# Patient Record
Sex: Male | Born: 1935
Health system: Southern US, Community
[De-identification: ages and names within clinical notes are randomized; demographics above are authoritative.]

## PROBLEM LIST (undated history)

## (undated) DIAGNOSIS — I10 Essential (primary) hypertension: Secondary | ICD-10-CM

## (undated) DIAGNOSIS — F0390 Unspecified dementia without behavioral disturbance: Secondary | ICD-10-CM

## (undated) DIAGNOSIS — E785 Hyperlipidemia, unspecified: Secondary | ICD-10-CM

## (undated) HISTORY — PX: BURR HOLE FOR SUBDURAL HEMATOMA: SHX1275

---

## 1997-10-06 ENCOUNTER — Ambulatory Visit (HOSPITAL_COMMUNITY): Admission: RE | Admit: 1997-10-06 | Discharge: 1997-10-06 | Payer: Self-pay | Admitting: Internal Medicine

## 1999-04-16 ENCOUNTER — Ambulatory Visit (HOSPITAL_COMMUNITY): Admission: RE | Admit: 1999-04-16 | Discharge: 1999-04-16 | Payer: Self-pay | Admitting: Cardiology

## 1999-04-23 ENCOUNTER — Ambulatory Visit (HOSPITAL_COMMUNITY): Admission: RE | Admit: 1999-04-23 | Discharge: 1999-04-23 | Payer: Self-pay | Admitting: Cardiology

## 1999-04-23 ENCOUNTER — Encounter: Payer: Self-pay | Admitting: Cardiology

## 2003-07-25 ENCOUNTER — Emergency Department (HOSPITAL_COMMUNITY): Admission: AD | Admit: 2003-07-25 | Discharge: 2003-07-25 | Payer: Self-pay | Admitting: Emergency Medicine

## 2003-07-28 ENCOUNTER — Emergency Department (HOSPITAL_COMMUNITY): Admission: EM | Admit: 2003-07-28 | Discharge: 2003-07-28 | Payer: Self-pay | Admitting: Emergency Medicine

## 2003-07-29 ENCOUNTER — Emergency Department (HOSPITAL_COMMUNITY): Admission: EM | Admit: 2003-07-29 | Discharge: 2003-07-29 | Payer: Self-pay | Admitting: Emergency Medicine

## 2003-07-30 ENCOUNTER — Inpatient Hospital Stay (HOSPITAL_COMMUNITY): Admission: AD | Admit: 2003-07-30 | Discharge: 2003-08-01 | Payer: Self-pay | Admitting: Emergency Medicine

## 2003-08-02 ENCOUNTER — Emergency Department (HOSPITAL_COMMUNITY): Admission: EM | Admit: 2003-08-02 | Discharge: 2003-08-02 | Payer: Self-pay | Admitting: Emergency Medicine

## 2006-06-10 ENCOUNTER — Encounter (HOSPITAL_COMMUNITY): Admission: RE | Admit: 2006-06-10 | Discharge: 2006-08-13 | Payer: Self-pay | Admitting: Cardiology

## 2007-07-24 ENCOUNTER — Emergency Department (HOSPITAL_COMMUNITY): Admission: EM | Admit: 2007-07-24 | Discharge: 2007-07-24 | Payer: Self-pay | Admitting: Emergency Medicine

## 2009-05-31 ENCOUNTER — Emergency Department (HOSPITAL_COMMUNITY): Admission: EM | Admit: 2009-05-31 | Discharge: 2009-05-31 | Payer: Self-pay | Admitting: Emergency Medicine

## 2010-05-06 ENCOUNTER — Emergency Department (HOSPITAL_COMMUNITY)
Admission: EM | Admit: 2010-05-06 | Discharge: 2010-05-06 | Payer: Self-pay | Source: Home / Self Care | Admitting: Emergency Medicine

## 2010-09-13 NOTE — Discharge Summary (Signed)
NAMEWALLIE, LAGRAND                            ACCOUNT NO.:  [id number]   MEDICAL RECORD NO.:  [id number]                   PATIENT TYPE:  INP   LOCATION:  2040                                 FACILITY:  [facility]   PHYSICIAN:  Mohan N. Sharyn Lull, M.D.              DATE OF BIRTH:  1926/10/04   DATE OF ADMISSION:  07/30/2003  DATE OF DISCHARGE:  08/01/2003                                 DISCHARGE SUMMARY   ADMISSION DIAGNOSES:  1. Recurrent epistaxis.  2. Anemia.  3. Hypercholesterolemia.  4. History of labile hypertension.   FINAL DIAGNOSES:  1. Status post recurrent epistaxis.  2. Anemia secondary to above.  3. History of labile blood pressure.  4. Hypercholesterolemia.  5. Elevated blood sugar, rule out diabetes mellitus.   DISCHARGE HOME MEDICATIONS:  Lipitor 10 mg one tablet daily.   ACTIVITY:  As tolerated.   DIET:  Low-salt, low-cholesterol, 1800 calorie ADA diet.  The patient has  been advised to monitor fasting blood sugar, fasting glucose, and hemoglobin  A1C in one week as an outpatient.   FOLLOWUP:  Follow up with me in one week.  Follow up with Dr. Pollyann Kennedy on  Thursday in two days.   CONDITION ON DISCHARGE:  Stable.   BRIEF HISTORY:  Mr. Brallier is a 77 year old black male with a past medical  history significant for labile blood pressure controlled with diet and  hypercholesterolemia.  He was admitted by Dr. Shana Chute on July 30, 2003,  because of recurrent nasal bleed for one week.  The patient was seen by ENT  and multiple times in the ER.  He had cauterization of the bleed on Friday  and subsequently again had bleeding on Saturday.  He then came to the ER and  was discharged.  He then came with profuse nasal bleeding and was noted to  be anemic, requiring blood transfusion.  The patient denies any chest pain,  palpitations, lightheadedness, or syncope.   MEDICATIONS:  At home, he was on Lipitor 10 mg p.o. daily.  Baby aspirin was  stopped last Monday.   FAMILY  HISTORY:  Noncontributory.   PHYSICAL EXAMINATION:  GENERAL APPEARANCE:  He was alert and awake.  VITAL SIGNS:  The blood pressure was 167/77 and the pulse was 129 with sinus  tachycardia on the monitor.  HEENT:  The left nostril was packed.  NECK:  No JVD.  LUNGS:  Clear to auscultation.  HEART:  S1 and S2 were normal.  There was no S3 gallop.  ABDOMEN:  Soft and nontender.  EXTREMITIES:  There was no cyanosis, clubbing, or edema.   LABORATORIES:  His sodium was 137, potassium 3.0, chloride 106, blood sugar  174, BUN 15, and creatinine 1.0.  The hemoglobin was 9.9 and hematocrit  29.0.  The repeat hemoglobin was 8.6 and hematocrit 25.2.  Post transfusion  of one unit, the hemoglobin was 9.4, hematocrit 27.0, and  white count 7.3.  His PT was 13.9, INR 1.1, and PTT 39.  His hemoglobin today is 9.8 and  hematocrit 29.3.  The potassium is 3.9, glucose 148, BUN 7, and creatinine  1.0.   BRIEF HOSPITAL COURSE:  The patient was admitted to the telemetry unit.  The  patient received one unit of packed red blood cells.  The patient did not  have any further episodes of nasal bleed.  ENT consultation was called with  Dr. Pollyann Kennedy.  The patient's hemoglobin has been stable for the last 48 hours.  The patient still has nasal packing.  The plan is to keep the nasal packing  for a total of four days.  The patient will be seeing Dr. Pollyann Kennedy on Thursday  for removal of the packing and possible cauterization.  The patient's blood  sugar is running in the range of 130-160.  In the past, the patient has had  normal blood sugar.  We will check fasting blood sugar in one week and also  hemoglobin A1C as an outpatient.  The patient will be discharged home on the  above medications and will be followed up in my office in one week.                                                Eduardo Osier. Sharyn Lull, M.D.    [facility]  D:  08/01/2003  T:  08/03/2003  Job:  [id number]

## 2011-04-08 ENCOUNTER — Emergency Department (HOSPITAL_COMMUNITY): Payer: Medicare Other

## 2011-04-08 ENCOUNTER — Encounter: Payer: Self-pay | Admitting: Emergency Medicine

## 2011-04-08 ENCOUNTER — Emergency Department (HOSPITAL_COMMUNITY)
Admission: EM | Admit: 2011-04-08 | Discharge: 2011-04-08 | Disposition: A | Discharge status: Home / Self Care | Payer: Medicare Other | Attending: Emergency Medicine | Admitting: Emergency Medicine

## 2011-04-08 DIAGNOSIS — R070 Pain in throat: Secondary | ICD-10-CM | POA: Insufficient documentation

## 2011-04-08 DIAGNOSIS — Z79899 Other long term (current) drug therapy: Secondary | ICD-10-CM | POA: Insufficient documentation

## 2011-04-08 DIAGNOSIS — J40 Bronchitis, not specified as acute or chronic: Secondary | ICD-10-CM | POA: Insufficient documentation

## 2011-04-08 DIAGNOSIS — R059 Cough, unspecified: Secondary | ICD-10-CM | POA: Insufficient documentation

## 2011-04-08 DIAGNOSIS — R05 Cough: Secondary | ICD-10-CM | POA: Insufficient documentation

## 2011-04-08 MED ORDER — ALBUTEROL SULFATE HFA 108 (90 BASE) MCG/ACT IN AERS: 2.0000 | INHALATION_SPRAY | RESPIRATORY_TRACT | Status: DC | PRN

## 2011-04-08 MED ORDER — BENZONATATE 200 MG PO CAPS: 200.0000 mg | ORAL_CAPSULE | Freq: Three times a day (TID) | ORAL | Status: AC | PRN

## 2011-04-08 MED ORDER — AZITHROMYCIN 250 MG PO TABS: 250.0000 mg | ORAL_TABLET | Freq: Every day | ORAL | Status: AC

## 2011-04-08 MED ORDER — AZITHROMYCIN 250 MG PO TABS
500.0000 mg | ORAL_TABLET | Freq: Once | ORAL | Status: AC
  Administered 2011-04-08: 500 mg via ORAL
  Filled 2011-04-08: qty 2

## 2011-04-08 NOTE — ED Notes (Signed)
PT. REPORTS PERSISTENT DRY COUGH WITH SLIGHT SOB X2 DAYS , HEADACHE , DENIES FEVER OR CHILLS.

## 2011-04-08 NOTE — ED Provider Notes (Signed)
History     CSN: [id number] Arrival date & time: 04/08/2011  1:49 AM   First MD Initiated Contact with Patient 04/08/11 0405      Chief Complaint  Patient presents with  . Cough    (Consider location/radiation/quality/duration/timing/severity/associated sxs/prior treatment) HPI Comments: Healthy 77 year old male who presents with a cough for the last 2 days. Symptoms are intermittent, dry but feels like he has mucus rattling around in his chest. He does have an associated sore throat but no fevers chills nausea or vomiting. Symptoms are persistent, nothing makes better or worse, symptoms are mild to moderate at this time  Patient is a 77 y.o. male presenting with cough. The history is provided by the patient.  Cough    History reviewed. No pertinent past medical history.  History reviewed. No pertinent past surgical history.  No family history on file.  History  Substance Use Topics  . Smoking status: Never Smoker   . Smokeless tobacco: Not on file  . Alcohol Use: No      Review of Systems  Respiratory: Positive for cough.   All other systems reviewed and are negative.    Allergies  Penicillins  Home Medications   Current Outpatient Rx  Name Route Sig Dispense Refill  . ATORVASTATIN CALCIUM 10 MG PO TABS Oral Take 10 mg by mouth daily.      . ALBUTEROL SULFATE HFA 108 (90 BASE) MCG/ACT IN AERS Inhalation Inhale 2 puffs into the lungs every 4 (four) hours as needed for wheezing or shortness of breath. 1 Inhaler 3  . AZITHROMYCIN 250 MG PO TABS Oral Take 1 tablet (250 mg total) by mouth daily. 500mg  PO day 1, then 250mg  PO days 205 6 tablet 0  . BENZONATATE 200 MG PO CAPS Oral Take 1 capsule (200 mg total) by mouth 3 (three) times daily as needed for cough. 20 capsule 0    BP 159/81  Pulse 101  Temp(Src) 98.1 F (36.7 C) (Oral)  Resp 19  SpO2 95%  Physical Exam  Nursing note and vitals reviewed. Constitutional: He appears well-developed and  well-nourished. No distress.  HENT:  Head: Normocephalic and atraumatic.  Mouth/Throat: Oropharynx is clear and moist. No oropharyngeal exudate.  Eyes: Conjunctivae and EOM are normal. Pupils are equal, round, and reactive to light. Right eye exhibits no discharge. Left eye exhibits no discharge. No scleral icterus.  Neck: Normal range of motion. Neck supple. No JVD present. No thyromegaly present.  Cardiovascular: Normal rate, regular rhythm, normal heart sounds and intact distal pulses.  Exam reveals no gallop and no friction rub.   No murmur heard. Pulmonary/Chest: Effort normal and breath sounds normal. No respiratory distress. He has no wheezes. He has no rales.  Abdominal: Soft. Bowel sounds are normal. He exhibits no distension and no mass. There is no tenderness.  Musculoskeletal: Normal range of motion. He exhibits no edema and no tenderness.  Lymphadenopathy:    He has no cervical adenopathy.  Neurological: He is alert. Coordination normal.  Skin: Skin is warm and dry. No rash noted. No erythema.  Psychiatric: He has a normal mood and affect. His behavior is normal.    ED Course  Procedures (including critical care time)  Labs Reviewed - No data to display Dg Chest 2 View  04/08/2011  *RADIOLOGY REPORT*  Clinical Data: Cough and shortness of breath for 2 days.  Headache.  CHEST - 2 VIEW  Comparison: None.  Findings: Emphysematous changes in the lungs. The heart size and  pulmonary vascularity are normal. The lungs appear clear and expanded without focal air space disease or consolidation. No blunting of the costophrenic angles. Mild thoracic scoliosis convex towards the right.  Degenerative changes in the thoracic spine. Suggestion of coarsened trabecula in the right proximal humerus which might represent old fracture deformity or possibly changes due to Paget's disease.  IMPRESSION: Emphysematous changes in the chest.  No evidence of active pulmonary disease.  Original Report  Authenticated By: Marlon Pel, M.D.     1. Bronchitis       MDM  Lungs are clear, chest x-ray reveals no signs of infiltrates. The patient is not currently a smoker and has not been for 30 years, has vital signs which are reassuring and his in no respiratory distress. He has no increased work of breathing, fevers, vomiting and is able to take medicines by mouth. He agrees to followup with his family Dr. within 2-3 days if not improved        Vida Roller, MD 04/08/11 [phone]

## 2015-11-05 ENCOUNTER — Other Ambulatory Visit: Payer: Self-pay | Admitting: Cardiology

## 2015-11-05 DIAGNOSIS — R079 Chest pain, unspecified: Secondary | ICD-10-CM

## 2015-11-09 ENCOUNTER — Encounter (HOSPITAL_COMMUNITY)
Admission: RE | Admit: 2015-11-09 | Discharge: 2015-11-09 | Disposition: A | Discharge status: Home / Self Care | Payer: Medicare HMO | Source: Ambulatory Visit | Attending: Cardiology | Admitting: Cardiology

## 2015-11-09 DIAGNOSIS — R079 Chest pain, unspecified: Secondary | ICD-10-CM | POA: Insufficient documentation

## 2015-11-09 MED ORDER — REGADENOSON 0.4 MG/5ML IV SOLN
INTRAVENOUS | Status: AC
  Filled 2015-11-09: qty 5

## 2015-11-09 MED ORDER — TECHNETIUM TC 99M TETROFOSMIN IV KIT
10.0000 | PACK | Freq: Once | INTRAVENOUS | Status: AC | PRN
  Administered 2015-11-09: 10 via INTRAVENOUS

## 2015-11-09 MED ORDER — REGADENOSON 0.4 MG/5ML IV SOLN
0.4000 mg | Freq: Once | INTRAVENOUS | Status: AC
  Administered 2015-11-09: 0.4 mg via INTRAVENOUS

## 2015-11-09 MED ORDER — TECHNETIUM TC 99M TETROFOSMIN IV KIT
30.0000 | PACK | Freq: Once | INTRAVENOUS | Status: AC | PRN
  Administered 2015-11-09: 30 via INTRAVENOUS

## 2016-02-26 ENCOUNTER — Encounter (HOSPITAL_COMMUNITY): Payer: Self-pay | Admitting: *Deleted

## 2016-02-26 DIAGNOSIS — Z7982 Long term (current) use of aspirin: Secondary | ICD-10-CM | POA: Insufficient documentation

## 2016-02-26 DIAGNOSIS — R112 Nausea with vomiting, unspecified: Secondary | ICD-10-CM | POA: Insufficient documentation

## 2016-02-26 DIAGNOSIS — R197 Diarrhea, unspecified: Secondary | ICD-10-CM | POA: Insufficient documentation

## 2016-02-26 DIAGNOSIS — R1013 Epigastric pain: Secondary | ICD-10-CM | POA: Diagnosis present

## 2016-02-26 LAB — COMPREHENSIVE METABOLIC PANEL
ALBUMIN: 3.8 g/dL (ref 3.5–5.0)
ALT: 21 U/L (ref 17–63)
AST: 28 U/L (ref 15–41)
Alkaline Phosphatase: 86 U/L (ref 38–126)
Anion gap: 6 (ref 5–15)
BUN: 17 mg/dL (ref 6–20)
CHLORIDE: 110 mmol/L (ref 101–111)
CO2: 24 mmol/L (ref 22–32)
CREATININE: 1.07 mg/dL (ref 0.61–1.24)
Calcium: 9.6 mg/dL (ref 8.9–10.3)
GFR calc non Af Amer: >60 mL/min (ref >60)
Glucose, Bld: 139 mg/dL — ABNORMAL HIGH (ref 65–99)
Potassium: 4.5 mmol/L (ref 3.5–5.1)
SODIUM: 140 mmol/L (ref 135–145)
Total Bilirubin: 0.8 mg/dL (ref 0.3–1.2)
Total Protein: 7 g/dL (ref 6.5–8.1)

## 2016-02-26 LAB — URINALYSIS, ROUTINE W REFLEX MICROSCOPIC
Bilirubin Urine: NEGATIVE
GLUCOSE, UA: NEGATIVE mg/dL
HGB URINE DIPSTICK: NEGATIVE
Ketones, ur: NEGATIVE mg/dL
Nitrite: NEGATIVE
PROTEIN: NEGATIVE mg/dL
SPECIFIC GRAVITY, URINE: 1.021 (ref 1.005–1.030)
pH: 6.5 (ref 5.0–8.0)

## 2016-02-26 LAB — CBC
HCT: 39.7 % (ref 39.0–52.0)
Hemoglobin: 13.2 g/dL (ref 13.0–17.0)
MCH: 29.3 pg (ref 26.0–34.0)
MCHC: 33.2 g/dL (ref 30.0–36.0)
MCV: 88.2 fL (ref 78.0–100.0)
PLATELETS: 244 10*3/uL (ref 150–400)
RBC: 4.5 MIL/uL (ref 4.22–5.81)
RDW: 14.1 % (ref 11.5–15.5)
WBC: 8.8 10*3/uL (ref 4.0–10.5)

## 2016-02-26 LAB — LIPASE, BLOOD: LIPASE: 29 U/L (ref 11–51)

## 2016-02-26 LAB — URINE MICROSCOPIC-ADD ON: RBC / HPF: NONE SEEN RBC/hpf (ref 0–5)

## 2016-02-26 NOTE — ED Triage Notes (Signed)
The pt is c/o abd cramps with n v and diarrhea since this am he thinks he has food poisoning he had much meat things yesterday that he did not cook

## 2016-02-27 ENCOUNTER — Emergency Department (HOSPITAL_COMMUNITY)
Admission: EM | Admit: 2016-02-27 | Discharge: 2016-02-27 | Disposition: A | Discharge status: Home / Self Care | Payer: Medicare HMO | Attending: Emergency Medicine | Admitting: Emergency Medicine

## 2016-02-27 DIAGNOSIS — R112 Nausea with vomiting, unspecified: Secondary | ICD-10-CM

## 2016-02-27 DIAGNOSIS — R197 Diarrhea, unspecified: Secondary | ICD-10-CM

## 2016-02-27 LAB — POC OCCULT BLOOD, ED: FECAL OCCULT BLD: POSITIVE — AB

## 2016-02-27 MED ORDER — LOPERAMIDE HCL 2 MG PO CAPS
4.0000 mg | ORAL_CAPSULE | Freq: Once | ORAL | Status: AC
  Administered 2016-02-27: 4 mg via ORAL
  Filled 2016-02-27: qty 2

## 2016-02-27 MED ORDER — SODIUM CHLORIDE 0.9 % IV BOLUS (SEPSIS)
1000.0000 mL | Freq: Once | INTRAVENOUS | Status: AC
  Administered 2016-02-27: 1000 mL via INTRAVENOUS

## 2016-02-27 MED ORDER — ONDANSETRON HCL 4 MG/2ML IJ SOLN
4.0000 mg | Freq: Once | INTRAMUSCULAR | Status: AC
  Administered 2016-02-27: 4 mg via INTRAVENOUS
  Filled 2016-02-27: qty 2

## 2016-02-27 MED ORDER — CIPROFLOXACIN HCL 500 MG PO TABS: 500.0000 mg | ORAL_TABLET | Freq: Two times a day (BID) | ORAL | 0 refills | Status: DC

## 2016-02-27 MED ORDER — ONDANSETRON 4 MG PO TBDP: 4.0000 mg | ORAL_TABLET | Freq: Three times a day (TID) | ORAL | 0 refills | Status: DC | PRN

## 2016-02-27 MED ORDER — METRONIDAZOLE 500 MG PO TABS: 500.0000 mg | ORAL_TABLET | Freq: Two times a day (BID) | ORAL | 0 refills | Status: DC

## 2016-02-27 NOTE — Discharge Instructions (Signed)
You were seen today for nausea, vomiting, diarrhea. Your workup is largely reassuring. There is no obvious blood on rectal exam. He will be discharged home with ciprofloxacin and Flagyl to cover for possible colitis versus food borne illness. Follow-up with her primary physician as soon as possible for recheck. If you develop abdominal pain, worsening nausea or vomiting, worsening bloody stools you should be reevaluated immediately. Follow-up with GI for outpatient colonoscopy.

## 2016-02-27 NOTE — ED Provider Notes (Signed)
[facility]-EMERGENCY DEPT Provider Note   CSN: [id number] Arrival date & time: 02/26/16  2041   By signing my name below, I, Henry Harvey, attest that this documentation has been prepared under the direction and in the presence of Shon Batonourtney F Jinx Gilden, MD  Electronically Signed: Clovis PuAvnee Harvey, ED Scribe. 02/27/16. 2:57 AM.   History   Chief Complaint Chief Complaint  Patient presents with  . Abdominal Pain   The history is provided by the patient. No language interpreter was used.   HPI Comments:  Henry Harvey is a 82 y.o. male who presents to the Emergency Department complaining of vomiting and diarrhea which began at 8 AM x 1 day. Pt also notes "medium" abdominal pain in the epigastrium and  Bloody stool which he noticed ~ 4 PM-5 PM. He states he noticed some blood in his stool for some bowel movements. Pt notes associatedintermittent chills. He notes he may have eaten something bad yesterday to cause his symptoms. Pt has only some chicken soup today. He notes his last episode of diarrhea was in the ED. No alleviating factors noted. Pt denies hx of heart disease, hx of lung disease, fevers, and any sick contacts. No alleviating factors noted. He denies any other symptoms or complaints at this time.  History reviewed. No pertinent past medical history.  There are no active problems to display for this patient.   History reviewed. No pertinent surgical history.  Home Medications    Prior to Admission medications   Medication Sig Start Date End Date Taking? Authorizing Provider  amLODipine (NORVASC) 2.5 MG tablet Take 2.5 mg by mouth daily.   Yes Historical Provider, MD  aspirin 81 MG chewable tablet Chew by mouth daily.   Yes Historical Provider, MD  atorvastatin (LIPITOR) 10 MG tablet Take 10 mg by mouth daily.     Yes Historical Provider, MD  dorzolamide (TRUSOPT) 2 % ophthalmic solution Place 1 drop into both eyes daily.   Yes Historical Provider, MD  latanoprost (XALATAN) 0.005 %  ophthalmic solution Place 1 drop into both eyes at bedtime.   Yes Historical Provider, MD  nitroGLYCERIN (NITRODUR - DOSED IN MG/24 HR) 0.2 mg/hr patch Place 0.2 mg onto the skin daily. Apply every day per patient   Yes Historical Provider, MD  albuterol (PROVENTIL HFA;VENTOLIN HFA) 108 (90 BASE) MCG/ACT inhaler Inhale 2 puffs into the lungs every 4 (four) hours as needed for wheezing or shortness of breath. 04/08/11 04/07/12  Eber HongBrian Miller, MD  ciprofloxacin (CIPRO) 500 MG tablet Take 1 tablet (500 mg total) by mouth every 12 (twelve) hours. 02/27/16   Shon Batonourtney F Exzavier Ruderman, MD  metroNIDAZOLE (FLAGYL) 500 MG tablet Take 1 tablet (500 mg total) by mouth 2 (two) times daily. 02/27/16   Shon Batonourtney F Elleanna Melling, MD  ondansetron (ZOFRAN ODT) 4 MG disintegrating tablet Take 1 tablet (4 mg total) by mouth every 8 (eight) hours as needed for nausea or vomiting. 02/27/16   Shon Batonourtney F Kennisha Qin, MD    Family History No family history on file.  Social History Social History  Substance Use Topics  . Smoking status: Never Smoker  . Smokeless tobacco: Never Used  . Alcohol use No     Allergies   Penicillins   Review of Systems Review of Systems  Constitutional: Positive for chills. Negative for fever.  Gastrointestinal: Positive for abdominal pain, blood in stool, diarrhea and vomiting.  Neurological: Negative for dizziness.  All other systems reviewed and are negative.    Physical Exam Updated Vital Signs  BP 159/80   Pulse 86   Temp 98.4 F (36.9 C) (Oral)   Resp 19   Ht 5\' 4"  (1.626 m)   Wt 112 lb 2 oz (50.9 kg)   SpO2 91%   BMI 19.25 kg/m   Physical Exam  Constitutional: He is oriented to person, place, and time. He appears well-developed and well-nourished.  Appears younger than stated age  HENT:  Head: Normocephalic and atraumatic.  Mucous membranes moist  Eyes: Pupils are equal, round, and reactive to light.  Cardiovascular: Normal rate, regular rhythm and normal heart sounds.   No  murmur heard. Pulmonary/Chest: Effort normal and breath sounds normal. No respiratory distress. He has no wheezes.  Abdominal: Soft. Bowel sounds are normal. There is no tenderness. There is no rebound and no guarding.  Genitourinary: Rectal exam shows guaiac positive stool.  Genitourinary Comments: No gross blood  Musculoskeletal: He exhibits no edema.  Neurological: He is alert and oriented to person, place, and time.  Skin: Skin is warm and dry.  Psychiatric: He has a normal mood and affect.  Nursing note and vitals reviewed.    ED Treatments / Results  DIAGNOSTIC STUDIES:  Oxygen Saturation is 98% on RA, normal by my interpretation.    COORDINATION OF CARE:  2:38 AM Discussed treatment plan with pt at bedside and pt agreed to plan.  Labs (all labs ordered are listed, but only abnormal results are displayed) Labs Reviewed  COMPREHENSIVE METABOLIC PANEL - Abnormal; Notable for the following:       Result Value   Glucose, Bld 139 (*)    All other components within normal limits  URINALYSIS, ROUTINE W REFLEX MICROSCOPIC (NOT AT [facility]) - Abnormal; Notable for the following:    Leukocytes, UA SMALL (*)    All other components within normal limits  URINE MICROSCOPIC-ADD ON - Abnormal; Notable for the following:    Squamous Epithelial / LPF 0-5 (*)    Bacteria, UA RARE (*)    All other components within normal limits  POC OCCULT BLOOD, ED - Abnormal; Notable for the following:    Fecal Occult Bld POSITIVE (*)    All other components within normal limits  LIPASE, BLOOD  CBC    EKG  EKG Interpretation None       Radiology No results found.  Procedures Procedures (including critical care time)  Medications Ordered in ED Medications  sodium chloride 0.9 % bolus 1,000 mL (1,000 mLs Intravenous New Bag/Given 02/27/16 0403)  ondansetron (ZOFRAN) injection 4 mg (4 mg Intravenous Given 02/27/16 0403)  loperamide (IMODIUM) capsule 4 mg (4 mg Oral Given 02/27/16 0404)      Initial Impression / Assessment and Plan / ED Course  I have reviewed the triage vital signs and the nursing notes.  Pertinent labs & imaging results that were available during my care of the patient were reviewed by me and considered in my medical decision making (see chart for details).  Clinical Course   Patient presents with nausea, vomiting, and diarrhea. Also noted bloody stools this afternoon. Reports intermittent epigastric pain but no pain at this time. His exam is benign. No gross blood. Hemoccult is positive. Vital signs are reassuring. Patient was given Zofran and fluids. Lab work obtained. Lab work is largely unremarkable. Hemoglobin is normal. No significant leukocytosis. CMP reassuring. No active bleeding, vomiting, or diarrhea while in the emergency department. Patient was rechecked on multiple occasions and well-appearing. Exam remains benign. Discussed with patient utility imaging at this time.  Considerations include gastroenteritis, colitis, food borne illness, diverticulitis, diverticulosis. Less likely other intra-abdominal pathology such as appendicitis, small bowel obstruction, perforation. Discussed with patient possible CT scan of the abdomen some: However, given his reassuring abdominal exam, do not feel strongly about this. Patient would like to forego imaging at this time as he states he feels well. Given association with food and possible colitis, will place on Cipro and Flagyl for 1 week. Patient was encouraged follow-up closely with his primary physician. He was also given GI follow-up. If he develops worsening abdominal pain, vomiting, or increased bloody stools he needs to be reevaluated immediately. Patient stated understanding.   After history, exam, and medical workup I feel the patient has been appropriately medically screened and is safe for discharge home. Pertinent diagnoses were discussed with the patient. Patient was given return precautions.  Final  Clinical Impressions(s) / ED Diagnoses   Final diagnoses:  Nausea vomiting and diarrhea    New Prescriptions New Prescriptions   CIPROFLOXACIN (CIPRO) 500 MG TABLET    Take 1 tablet (500 mg total) by mouth every 12 (twelve) hours.   METRONIDAZOLE (FLAGYL) 500 MG TABLET    Take 1 tablet (500 mg total) by mouth 2 (two) times daily.   ONDANSETRON (ZOFRAN ODT) 4 MG DISINTEGRATING TABLET    Take 1 tablet (4 mg total) by mouth every 8 (eight) hours as needed for nausea or vomiting.   I personally performed the services described in this documentation, which was scribed in my presence. The recorded information has been reviewed and is accurate.     Shon Batonourtney F Shir Bergman, MD 02/27/16 [phone]

## 2016-04-05 ENCOUNTER — Emergency Department (HOSPITAL_COMMUNITY)
Admission: EM | Admit: 2016-04-05 | Discharge: 2016-04-05 | Disposition: A | Discharge status: Home / Self Care | Payer: Medicare HMO | Attending: Emergency Medicine | Admitting: Emergency Medicine

## 2016-04-05 ENCOUNTER — Emergency Department (HOSPITAL_COMMUNITY): Payer: Medicare HMO

## 2016-04-05 ENCOUNTER — Encounter (HOSPITAL_COMMUNITY): Payer: Self-pay | Admitting: Emergency Medicine

## 2016-04-05 DIAGNOSIS — Z7982 Long term (current) use of aspirin: Secondary | ICD-10-CM | POA: Insufficient documentation

## 2016-04-05 DIAGNOSIS — M545 Low back pain, unspecified: Secondary | ICD-10-CM

## 2016-04-05 NOTE — ED Triage Notes (Signed)
Patient reports lower back pain x1 week. States he is unsure if he hurt it at work. Patient friend, reports she noticed patient had slurred speech and "posture change" x1 week ago lasting two days but states symptoms have resolved. Patient ambulatory to triage.

## 2016-04-05 NOTE — ED Provider Notes (Signed)
[facility]-EMERGENCY DEPT Provider Note   CSN: [id number] Arrival date & time: 04/05/16  1411     History   Chief Complaint Chief Complaint  Patient presents with  . Back Pain    HPI Henry Harvey is a 82 y.o. male.  HPI Patient reports lower back pain x1 week. States he is unsure if he hurt it at work. Patient friend, reports she noticed patient had slurred speech and "posture change" x1 week ago lasting two days but states symptoms have resolved. Patient ambulatory to triage. History reviewed. No pertinent past medical history.  There are no active problems to display for this patient.   History reviewed. No pertinent surgical history.     Home Medications    Prior to Admission medications   Medication Sig Start Date End Date Taking? Authorizing Provider  albuterol (PROVENTIL HFA;VENTOLIN HFA) 108 (90 BASE) MCG/ACT inhaler Inhale 2 puffs into the lungs every 4 (four) hours as needed for wheezing or shortness of breath. 04/08/11 04/07/12  Eber HongBrian Miller, MD  amLODipine (NORVASC) 2.5 MG tablet Take 2.5 mg by mouth daily.    Historical Provider, MD  aspirin 81 MG chewable tablet Chew by mouth daily.    Historical Provider, MD  atorvastatin (LIPITOR) 10 MG tablet Take 10 mg by mouth daily.      Historical Provider, MD  ciprofloxacin (CIPRO) 500 MG tablet Take 1 tablet (500 mg total) by mouth every 12 (twelve) hours. 02/27/16   Shon Batonourtney F Horton, MD  dorzolamide (TRUSOPT) 2 % ophthalmic solution Place 1 drop into both eyes daily.    Historical Provider, MD  latanoprost (XALATAN) 0.005 % ophthalmic solution Place 1 drop into both eyes at bedtime.    Historical Provider, MD  metroNIDAZOLE (FLAGYL) 500 MG tablet Take 1 tablet (500 mg total) by mouth 2 (two) times daily. 02/27/16   Shon Batonourtney F Horton, MD  nitroGLYCERIN (NITRODUR - DOSED IN MG/24 HR) 0.2 mg/hr patch Place 0.2 mg onto the skin daily. Apply every day per patient    Historical Provider, MD  ondansetron (ZOFRAN ODT) 4 MG  disintegrating tablet Take 1 tablet (4 mg total) by mouth every 8 (eight) hours as needed for nausea or vomiting. 02/27/16   Shon Batonourtney F Horton, MD    Family History History reviewed. No pertinent family history.  Social History Social History  Substance Use Topics  . Smoking status: Never Smoker  . Smokeless tobacco: Never Used  . Alcohol use No     Allergies   Penicillins   Review of Systems Review of Systems  All other systems reviewed and are negative.    Physical Exam Updated Vital Signs BP 126/84 (BP Location: Left Arm)   Temp 98.5 F (36.9 C) (Oral)   Resp 20   Ht 5\' 6"  (1.676 m)   Wt 108 lb 6.4 oz (49.2 kg)   BMI 17.50 kg/m   Physical Exam  Constitutional: He is oriented to person, place, and time. He appears well-developed and well-nourished. No distress.  HENT:  Head: Normocephalic and atraumatic.  Eyes: Pupils are equal, round, and reactive to light.  Neck: Normal range of motion.  Cardiovascular: Normal rate and intact distal pulses.   Pulmonary/Chest: No respiratory distress.  Abdominal: Normal appearance. He exhibits no distension.  Musculoskeletal: Normal range of motion.       Lumbar back: He exhibits tenderness.       Back:  Neurological: He is alert and oriented to person, place, and time. No cranial nerve deficit.  Skin: Skin  is warm and dry. No rash noted.  Psychiatric: He has a normal mood and affect. His behavior is normal.  Nursing note and vitals reviewed.    ED Treatments / Results  Labs (all labs ordered are listed, but only abnormal results are displayed) Labs Reviewed - No data to display  EKG  EKG Interpretation None       Radiology Dg Lumbar Spine Complete  Result Date: 04/05/2016 CLINICAL DATA:  Lower back pain x 4 days with no known cause; hx back injury in 1978; EXAM: LUMBAR SPINE - COMPLETE 4+ VIEW COMPARISON:  None. FINDINGS: There is scoliosis, convex to left. Associated degenerative changes are present. No  evidence for acute fracture or subluxation. There is significant disc height loss throughout the lumbar spine, notably at L3-4, L4-5, and L5-S1. There is 4 mm retrolisthesis of L2 on L3. IMPRESSION: 1. Moderate degenerative changes ; scoliosis. 2.  No evidence for acute  abnormality. Electronically Signed   By: Norva PavlovElizabeth  Brown M.D.   On: 04/05/2016 15:50    Procedures Procedures (including critical care time)  Medications Ordered in ED Medications - No data to display   Initial Impression / Assessment and Plan / ED Course  I have reviewed the triage vital signs and the nursing notes.  Pertinent labs & imaging results that were available during my care of the patient were reviewed by me and considered in my medical decision making (see chart for details).  Clinical Course       Final Clinical Impressions(s) / ED Diagnoses   Final diagnoses:  Acute low back pain without sciatica, unspecified back pain laterality    New Prescriptions New Prescriptions   No medications on file     Nelva Nayobert Jenessa Gillingham, MD 04/05/16 [phone]

## 2016-05-28 ENCOUNTER — Inpatient Hospital Stay (HOSPITAL_COMMUNITY)
Admission: EM | Admit: 2016-05-28 | Discharge: 2016-06-03 | DRG: 026 | Disposition: A | Discharge status: Home / Self Care | Payer: Medicare HMO | Attending: Neurosurgery | Admitting: Neurosurgery

## 2016-05-28 ENCOUNTER — Emergency Department (HOSPITAL_COMMUNITY): Payer: Medicare HMO

## 2016-05-28 ENCOUNTER — Encounter (HOSPITAL_COMMUNITY)
Admission: EM | Disposition: A | Discharge status: Home / Self Care | Payer: Self-pay | Source: Home / Self Care | Attending: Neurosurgery

## 2016-05-28 ENCOUNTER — Emergency Department (HOSPITAL_COMMUNITY): Payer: Medicare HMO | Admitting: Certified Registered Nurse Anesthetist

## 2016-05-28 ENCOUNTER — Encounter (HOSPITAL_COMMUNITY): Payer: Self-pay

## 2016-05-28 DIAGNOSIS — G9389 Other specified disorders of brain: Secondary | ICD-10-CM | POA: Diagnosis present

## 2016-05-28 DIAGNOSIS — S065X9A Traumatic subdural hemorrhage with loss of consciousness of unspecified duration, initial encounter: Secondary | ICD-10-CM | POA: Diagnosis present

## 2016-05-28 DIAGNOSIS — E785 Hyperlipidemia, unspecified: Secondary | ICD-10-CM | POA: Diagnosis present

## 2016-05-28 DIAGNOSIS — D62 Acute posthemorrhagic anemia: Secondary | ICD-10-CM | POA: Diagnosis present

## 2016-05-28 DIAGNOSIS — I6200 Nontraumatic subdural hemorrhage, unspecified: Secondary | ICD-10-CM | POA: Diagnosis not present

## 2016-05-28 DIAGNOSIS — R739 Hyperglycemia, unspecified: Secondary | ICD-10-CM | POA: Diagnosis present

## 2016-05-28 DIAGNOSIS — W07XXXA Fall from chair, initial encounter: Secondary | ICD-10-CM | POA: Diagnosis present

## 2016-05-28 DIAGNOSIS — F0390 Unspecified dementia without behavioral disturbance: Secondary | ICD-10-CM | POA: Diagnosis present

## 2016-05-28 DIAGNOSIS — Y92018 Other place in single-family (private) house as the place of occurrence of the external cause: Secondary | ICD-10-CM

## 2016-05-28 DIAGNOSIS — S065XAA Traumatic subdural hemorrhage with loss of consciousness status unknown, initial encounter: Secondary | ICD-10-CM | POA: Diagnosis present

## 2016-05-28 DIAGNOSIS — R55 Syncope and collapse: Secondary | ICD-10-CM

## 2016-05-28 DIAGNOSIS — N179 Acute kidney failure, unspecified: Secondary | ICD-10-CM | POA: Diagnosis present

## 2016-05-28 DIAGNOSIS — I10 Essential (primary) hypertension: Secondary | ICD-10-CM | POA: Diagnosis present

## 2016-05-28 DIAGNOSIS — Z880 Allergy status to penicillin: Secondary | ICD-10-CM | POA: Diagnosis present

## 2016-05-28 HISTORY — PX: BURR HOLE: SHX908

## 2016-05-28 HISTORY — DX: Unspecified dementia, unspecified severity, without behavioral disturbance, psychotic disturbance, mood disturbance, and anxiety: F03.90

## 2016-05-28 HISTORY — DX: Hyperlipidemia, unspecified: E78.5

## 2016-05-28 HISTORY — DX: Essential (primary) hypertension: I10

## 2016-05-28 LAB — CBC
HCT: 36.1 % — ABNORMAL LOW (ref 39.0–52.0)
Hemoglobin: 11.6 g/dL — ABNORMAL LOW (ref 13.0–17.0)
MCH: 28.4 pg (ref 26.0–34.0)
MCHC: 32.1 g/dL (ref 30.0–36.0)
MCV: 88.3 fL (ref 78.0–100.0)
PLATELETS: 280 10*3/uL (ref 150–400)
RBC: 4.09 MIL/uL — AB (ref 4.22–5.81)
RDW: 13.9 % (ref 11.5–15.5)
WBC: 6.3 10*3/uL (ref 4.0–10.5)

## 2016-05-28 LAB — URINALYSIS, ROUTINE W REFLEX MICROSCOPIC
BILIRUBIN URINE: NEGATIVE
Glucose, UA: NEGATIVE mg/dL
KETONES UR: NEGATIVE mg/dL
LEUKOCYTES UA: NEGATIVE
NITRITE: NEGATIVE
PH: 5 (ref 5.0–8.0)
Protein, ur: NEGATIVE mg/dL
Specific Gravity, Urine: 1.019 (ref 1.005–1.030)

## 2016-05-28 LAB — PROTIME-INR
INR: 1.11
Prothrombin Time: 14.4 seconds (ref 11.4–15.2)

## 2016-05-28 LAB — BASIC METABOLIC PANEL
Anion gap: 10 (ref 5–15)
BUN: 23 mg/dL — ABNORMAL HIGH (ref 6–20)
CHLORIDE: 106 mmol/L (ref 101–111)
CO2: 23 mmol/L (ref 22–32)
CREATININE: 1.31 mg/dL — AB (ref 0.61–1.24)
Calcium: 9.7 mg/dL (ref 8.9–10.3)
GFR calc non Af Amer: 47 mL/min — ABNORMAL LOW (ref >60)
GFR, EST AFRICAN AMERICAN: 55 mL/min — AB (ref >60)
Glucose, Bld: 131 mg/dL — ABNORMAL HIGH (ref 65–99)
Potassium: 4.1 mmol/L (ref 3.5–5.1)
SODIUM: 139 mmol/L (ref 135–145)

## 2016-05-28 SURGERY — CREATION, CRANIAL BURR HOLE
Anesthesia: General | Site: Head | Laterality: Bilateral

## 2016-05-28 MED ORDER — THROMBIN 5000 UNITS EX SOLR
CUTANEOUS | Status: AC
  Filled 2016-05-28: qty 5000

## 2016-05-28 MED ORDER — OXYCODONE HCL 5 MG/5ML PO SOLN: 5.0000 mg | Freq: Once | ORAL | Status: DC | PRN

## 2016-05-28 MED ORDER — LIDOCAINE-EPINEPHRINE (PF) 2 %-1:200000 IJ SOLN
INTRAMUSCULAR | Status: AC
  Filled 2016-05-28: qty 20

## 2016-05-28 MED ORDER — FENTANYL CITRATE (PF) 100 MCG/2ML IJ SOLN
INTRAMUSCULAR | Status: DC | PRN
  Administered 2016-05-28: 100 ug via INTRAVENOUS

## 2016-05-28 MED ORDER — OXYCODONE HCL 5 MG PO TABS
5.0000 mg | ORAL_TABLET | Freq: Once | ORAL | Status: DC | PRN
Start: 2016-05-28 — End: 2016-05-29

## 2016-05-28 MED ORDER — SUCCINYLCHOLINE CHLORIDE 20 MG/ML IJ SOLN
INTRAMUSCULAR | Status: DC | PRN
  Administered 2016-05-28: 60 mg via INTRAVENOUS

## 2016-05-28 MED ORDER — SODIUM CHLORIDE 0.9 % IR SOLN
Status: DC | PRN
  Administered 2016-05-28: 500 mL

## 2016-05-28 MED ORDER — ONDANSETRON HCL 4 MG/2ML IJ SOLN
INTRAMUSCULAR | Status: DC | PRN
  Administered 2016-05-28: 4 mg via INTRAVENOUS

## 2016-05-28 MED ORDER — HYDRALAZINE HCL 20 MG/ML IJ SOLN
5.0000 mg | INTRAMUSCULAR | Status: DC | PRN
  Filled 2016-05-28: qty 1

## 2016-05-28 MED ORDER — FENTANYL CITRATE (PF) 100 MCG/2ML IJ SOLN
INTRAMUSCULAR | Status: AC
  Filled 2016-05-28: qty 2

## 2016-05-28 MED ORDER — POTASSIUM CHLORIDE IN NACL 20-0.9 MEQ/L-% IV SOLN
INTRAVENOUS | Status: DC
Start: 2016-05-28 — End: 2016-05-31
  Administered 2016-05-28 – 2016-05-31 (×3): via INTRAVENOUS
  Filled 2016-05-28 (×6): qty 1000

## 2016-05-28 MED ORDER — SODIUM CHLORIDE 0.9 % IV SOLN
INTRAVENOUS | Status: DC | PRN
  Administered 2016-05-28: 21:00:00 via INTRAVENOUS

## 2016-05-28 MED ORDER — FENTANYL CITRATE (PF) 100 MCG/2ML IJ SOLN: 25.0000 ug | INTRAMUSCULAR | Status: DC | PRN

## 2016-05-28 MED ORDER — PHENYLEPHRINE 40 MCG/ML (10ML) SYRINGE FOR IV PUSH (FOR BLOOD PRESSURE SUPPORT)
PREFILLED_SYRINGE | INTRAVENOUS | Status: AC
  Filled 2016-05-28: qty 10

## 2016-05-28 MED ORDER — ROCURONIUM BROMIDE 100 MG/10ML IV SOLN
INTRAVENOUS | Status: DC | PRN
  Administered 2016-05-28: 25 mg via INTRAVENOUS

## 2016-05-28 MED ORDER — PROPOFOL 10 MG/ML IV BOLUS
INTRAVENOUS | Status: DC | PRN
  Administered 2016-05-28: 100 mg via INTRAVENOUS
  Administered 2016-05-28: 30 mg via INTRAVENOUS

## 2016-05-28 MED ORDER — LIDOCAINE 2% (20 MG/ML) 5 ML SYRINGE
INTRAMUSCULAR | Status: AC
  Filled 2016-05-28: qty 5

## 2016-05-28 MED ORDER — THROMBIN 20000 UNITS EX SOLR
CUTANEOUS | Status: DC | PRN
  Administered 2016-05-28: 20 mL via TOPICAL

## 2016-05-28 MED ORDER — ROCURONIUM BROMIDE 50 MG/5ML IV SOSY
PREFILLED_SYRINGE | INTRAVENOUS | Status: AC
  Filled 2016-05-28: qty 5

## 2016-05-28 MED ORDER — 0.9 % SODIUM CHLORIDE (POUR BTL) OPTIME
TOPICAL | Status: DC | PRN
  Administered 2016-05-28 (×3): 1000 mL

## 2016-05-28 MED ORDER — BACITRACIN ZINC 500 UNIT/GM EX OINT
TOPICAL_OINTMENT | CUTANEOUS | Status: AC
  Filled 2016-05-28: qty 28.35

## 2016-05-28 MED ORDER — SODIUM CHLORIDE 0.9 % IV SOLN
INTRAVENOUS | Status: DC | PRN
  Administered 2016-05-28: 1000 mg via INTRAVENOUS

## 2016-05-28 MED ORDER — LIDOCAINE-EPINEPHRINE (PF) 2 %-1:200000 IJ SOLN
INTRAMUSCULAR | Status: DC | PRN
  Administered 2016-05-28: 5 mL via INTRADERMAL

## 2016-05-28 MED ORDER — VANCOMYCIN HCL IN DEXTROSE 1-5 GM/200ML-% IV SOLN
INTRAVENOUS | Status: AC
  Filled 2016-05-28: qty 200

## 2016-05-28 MED ORDER — ESMOLOL HCL 100 MG/10ML IV SOLN
INTRAVENOUS | Status: DC | PRN
  Administered 2016-05-28: 50 mg via INTRAVENOUS

## 2016-05-28 MED ORDER — THROMBIN 20000 UNITS EX SOLR
CUTANEOUS | Status: AC
Start: 2016-05-28 — End: 2016-05-28
  Filled 2016-05-28: qty 20000

## 2016-05-28 MED ORDER — SODIUM CHLORIDE 0.9 % IV BOLUS (SEPSIS)
500.0000 mL | Freq: Once | INTRAVENOUS | Status: AC
  Administered 2016-05-28: 500 mL via INTRAVENOUS

## 2016-05-28 MED ORDER — PROPOFOL 10 MG/ML IV BOLUS
INTRAVENOUS | Status: AC
  Filled 2016-05-28: qty 20

## 2016-05-28 MED ORDER — SUGAMMADEX SODIUM 200 MG/2ML IV SOLN
INTRAVENOUS | Status: DC | PRN
  Administered 2016-05-28: 100 mg via INTRAVENOUS

## 2016-05-28 MED ORDER — DEXAMETHASONE SODIUM PHOSPHATE 10 MG/ML IJ SOLN
INTRAMUSCULAR | Status: DC | PRN
  Administered 2016-05-28: 5 mg via INTRAVENOUS

## 2016-05-28 MED ORDER — EPHEDRINE 5 MG/ML INJ
INTRAVENOUS | Status: AC
  Filled 2016-05-28: qty 10

## 2016-05-28 MED ORDER — EPHEDRINE SULFATE 50 MG/ML IJ SOLN
INTRAMUSCULAR | Status: DC | PRN
  Administered 2016-05-28: 10 mg via INTRAVENOUS
  Administered 2016-05-28: 5 mg via INTRAVENOUS

## 2016-05-28 MED ORDER — ARTIFICIAL TEARS OP OINT
TOPICAL_OINTMENT | OPHTHALMIC | Status: AC
  Filled 2016-05-28: qty 3.5

## 2016-05-28 SURGICAL SUPPLY — 79 items
ADH SKN CLS APL DERMABOND .7 (GAUZE/BANDAGES/DRESSINGS) ×1
BAG DECANTER FOR FLEXI CONT (MISCELLANEOUS) ×3 IMPLANT
BANDAGE GAUZE 4  KLING STR (GAUZE/BANDAGES/DRESSINGS) IMPLANT
BLADE CLIPPER SURG (BLADE) IMPLANT
BLADE SURG 11 STRL SS (BLADE) ×3 IMPLANT
BNDG COHESIVE 4X5 TAN NS LF (GAUZE/BANDAGES/DRESSINGS) IMPLANT
BUR ACORN 9.0 PRECISION (BURR) ×2 IMPLANT
BUR ACORN 9.0MM PRECISION (BURR) ×1
CANISTER SUCT 3000ML PPV (MISCELLANEOUS) ×3 IMPLANT
CARTRIDGE OIL MAESTRO DRILL (MISCELLANEOUS) ×1 IMPLANT
CLIP TI MEDIUM 6 (CLIP) IMPLANT
CORDS BIPOLAR (ELECTRODE) ×3 IMPLANT
DECANTER SPIKE VIAL GLASS SM (MISCELLANEOUS) ×3 IMPLANT
DERMABOND ADVANCED (GAUZE/BANDAGES/DRESSINGS) ×2
DERMABOND ADVANCED .7 DNX12 (GAUZE/BANDAGES/DRESSINGS) ×1 IMPLANT
DIFFUSER DRILL AIR PNEUMATIC (MISCELLANEOUS) ×3 IMPLANT
DRAPE NEUROLOGICAL W/INCISE (DRAPES) ×2 IMPLANT
DRAPE SURG 17X23 STRL (DRAPES) IMPLANT
DRAPE WARM FLUID 44X44 (DRAPE) ×3 IMPLANT
DRSG OPSITE 4X5.5 SM (GAUZE/BANDAGES/DRESSINGS) ×8 IMPLANT
ELECT CAUTERY BLADE 6.4 (BLADE) ×3 IMPLANT
ELECT REM PT RETURN 9FT ADLT (ELECTROSURGICAL) ×3
ELECTRODE REM PT RTRN 9FT ADLT (ELECTROSURGICAL) ×1 IMPLANT
GAUZE SPONGE 4X4 12PLY STRL (GAUZE/BANDAGES/DRESSINGS) ×2 IMPLANT
GAUZE SPONGE 4X4 16PLY XRAY LF (GAUZE/BANDAGES/DRESSINGS) IMPLANT
GLOVE BIO SURGEON STRL SZ 6.5 (GLOVE) ×1 IMPLANT
GLOVE BIO SURGEON STRL SZ7 (GLOVE) ×2 IMPLANT
GLOVE BIO SURGEON STRL SZ7.5 (GLOVE) IMPLANT
GLOVE BIO SURGEON STRL SZ8 (GLOVE) ×3 IMPLANT
GLOVE BIO SURGEON STRL SZ8.5 (GLOVE) IMPLANT
GLOVE BIO SURGEONS STRL SZ 6.5 (GLOVE) ×1
GLOVE BIOGEL M 8.0 STRL (GLOVE) IMPLANT
GLOVE BIOGEL PI IND STRL 6.5 (GLOVE) IMPLANT
GLOVE BIOGEL PI INDICATOR 6.5 (GLOVE) ×2
GLOVE ECLIPSE 6.5 STRL STRAW (GLOVE) IMPLANT
GLOVE ECLIPSE 7.0 STRL STRAW (GLOVE) IMPLANT
GLOVE ECLIPSE 7.5 STRL STRAW (GLOVE) IMPLANT
GLOVE ECLIPSE 8.0 STRL XLNG CF (GLOVE) IMPLANT
GLOVE ECLIPSE 8.5 STRL (GLOVE) IMPLANT
GLOVE EXAM NITRILE LRG STRL (GLOVE) IMPLANT
GLOVE EXAM NITRILE XL STR (GLOVE) IMPLANT
GLOVE EXAM NITRILE XS STR PU (GLOVE) IMPLANT
GLOVE INDICATOR 6.5 STRL GRN (GLOVE) IMPLANT
GLOVE INDICATOR 7.0 STRL GRN (GLOVE) IMPLANT
GLOVE INDICATOR 7.5 STRL GRN (GLOVE) IMPLANT
GLOVE INDICATOR 8.0 STRL GRN (GLOVE) IMPLANT
GLOVE INDICATOR 8.5 STRL (GLOVE) ×3 IMPLANT
GLOVE OPTIFIT SS 8.0 STRL (GLOVE) IMPLANT
GLOVE SURG SS PI 6.5 STRL IVOR (GLOVE) IMPLANT
GOWN STRL REUS W/ TWL LRG LVL3 (GOWN DISPOSABLE) ×1 IMPLANT
GOWN STRL REUS W/ TWL XL LVL3 (GOWN DISPOSABLE) ×1 IMPLANT
GOWN STRL REUS W/TWL 2XL LVL3 (GOWN DISPOSABLE) ×3 IMPLANT
GOWN STRL REUS W/TWL LRG LVL3 (GOWN DISPOSABLE) ×3
GOWN STRL REUS W/TWL XL LVL3 (GOWN DISPOSABLE) ×3
HEMOSTAT SURGICEL 2X14 (HEMOSTASIS) IMPLANT
HOOK DURA (MISCELLANEOUS) ×3 IMPLANT
KIT BASIN OR (CUSTOM PROCEDURE TRAY) ×3 IMPLANT
KIT ROOM TURNOVER OR (KITS) ×3 IMPLANT
NDL HYPO 25X1 1.5 SAFETY (NEEDLE) ×1 IMPLANT
NEEDLE HYPO 25X1 1.5 SAFETY (NEEDLE) ×3 IMPLANT
NS IRRIG 1000ML POUR BTL (IV SOLUTION) ×5 IMPLANT
OIL CARTRIDGE MAESTRO DRILL (MISCELLANEOUS) ×3
PACK CRANIOTOMY (CUSTOM PROCEDURE TRAY) ×3 IMPLANT
PAD ARMBOARD 7.5X6 YLW CONV (MISCELLANEOUS) ×9 IMPLANT
PATTIES SURGICAL .25X.25 (GAUZE/BANDAGES/DRESSINGS) IMPLANT
PATTIES SURGICAL .5 X.5 (GAUZE/BANDAGES/DRESSINGS) IMPLANT
PATTIES SURGICAL .5 X3 (DISPOSABLE) IMPLANT
PATTIES SURGICAL 1X1 (DISPOSABLE) IMPLANT
PIN MAYFIELD SKULL DISP (PIN) IMPLANT
SPONGE NEURO XRAY DETECT 1X3 (DISPOSABLE) IMPLANT
SPONGE SURGIFOAM ABS GEL 100 (HEMOSTASIS) ×2 IMPLANT
STAPLER VISISTAT 35W (STAPLE) ×3 IMPLANT
SUT NURALON 4 0 TR CR/8 (SUTURE) ×6 IMPLANT
SUT VIC AB 2-0 CT1 18 (SUTURE) ×3 IMPLANT
SYR CONTROL 10ML LL (SYRINGE) ×3 IMPLANT
TOWEL OR 17X24 6PK STRL BLUE (TOWEL DISPOSABLE) ×3 IMPLANT
TOWEL OR 17X26 10 PK STRL BLUE (TOWEL DISPOSABLE) ×3 IMPLANT
TRAY FOLEY W/METER SILVER 16FR (SET/KITS/TRAYS/PACK) IMPLANT
WATER STERILE IRR 1000ML POUR (IV SOLUTION) ×3 IMPLANT

## 2016-05-28 NOTE — ED Notes (Signed)
Dr. Pecola Leisureeese explained CT result to pt. and family / admission plan to pt.

## 2016-05-28 NOTE — Consult Note (Signed)
PULMONARY / CRITICAL CARE MEDICINE   Name: Henry Harvey MRN: [id number] DOB: 11/22/1926    ADMISSION DATE:  05/28/2016 CONSULTATION DATE:  1/31  REFERRING MD:  Dr. Wynetta Emeryram  CHIEF COMPLAINT:  SDH  HISTORY OF PRESENT ILLNESS:  Patient is encephalopathic and/or intubated. Therefore history has been obtained from chart review. 83 year old male with PMH significant for HTN, HLD, and recent diagnosis of dementia and started on memantine/donepezil (3 days PTA). He was at home in his usual state of health the morning of 1/31 just before eating breakfast when he suffered a syncopal episode and fell out of his chair and vomiting. Family reports no injuries sustained during the fall, however, for the remainder of the day he became more unsteady on his feet. For this reason he was brought to ED and found to have bilateral subacute SDH and was emergently taken to the OR for burr hole drainage under Dr. Wynetta Emeryram. Post operatively he was extubated and admitted to ICU. PCCM to see for medical management.   PAST MEDICAL HISTORY :  He  has no past medical history on file.  PAST SURGICAL HISTORY: He  has no past surgical history on file.  Allergies  Allergen Reactions  . Penicillins Rash and Other (See Comments)    Has patient had a PCN reaction causing immediate rash, facial/tongue/throat swelling, SOB or lightheadedness with hypotension: Yes Has patient had a PCN reaction causing severe rash involving mucus membranes or skin necrosis: No Has patient had a PCN reaction that required hospitalization: No Has patient had a PCN reaction occurring within the last 10 years: No If all of the above answers are "NO", then may proceed with Cephalosporin use.    No current facility-administered medications on file prior to encounter.    Current Outpatient Prescriptions on File Prior to Encounter  Medication Sig  . amLODipine (NORVASC) 2.5 MG tablet Take 2.5 mg by mouth daily.  Marland Kitchen. atorvastatin (LIPITOR) 10 MG tablet Take  10 mg by mouth daily.    Marland Kitchen. latanoprost (XALATAN) 0.005 % ophthalmic solution Place 1 drop into both eyes at bedtime.  Marland Kitchen. albuterol (PROVENTIL HFA;VENTOLIN HFA) 108 (90 BASE) MCG/ACT inhaler Inhale 2 puffs into the lungs every 4 (four) hours as needed for wheezing or shortness of breath.  . ciprofloxacin (CIPRO) 500 MG tablet Take 1 tablet (500 mg total) by mouth every 12 (twelve) hours. (Patient not taking: Reported on 05/28/2016)  . metroNIDAZOLE (FLAGYL) 500 MG tablet Take 1 tablet (500 mg total) by mouth 2 (two) times daily. (Patient not taking: Reported on 05/28/2016)  . nitroGLYCERIN (NITRODUR - DOSED IN MG/24 HR) 0.2 mg/hr patch Place 0.2 mg onto the skin daily.   . ondansetron (ZOFRAN ODT) 4 MG disintegrating tablet Take 1 tablet (4 mg total) by mouth every 8 (eight) hours as needed for nausea or vomiting. (Patient not taking: Reported on 05/28/2016)    FAMILY HISTORY:  His has no family status information on file.    SOCIAL HISTORY: He  reports that he has never smoked. He has never used smokeless tobacco. He reports that he does not drink alcohol or use drugs.  REVIEW OF SYSTEMS:   Unable due to encephloapthy  SUBJECTIVE:    VITAL SIGNS: BP (!) 145/54 (BP Location: Right Arm)   Pulse 70   Temp 97 F (36.1 C)   Resp (!) 21   SpO2 100%   HEMODYNAMICS:    VENTILATOR SETTINGS:    INTAKE / OUTPUT: No intake/output data recorded.  PHYSICAL EXAMINATION: General:  Frail elderly male in NAD Neuro:  Somnolent (only been out of OR about 10 mins at time of my eval), awakens to voice and follows commands.  HEENT:  [place]/AT, PERRL, no JVD Cardiovascular:  RRR, no MRG Lungs:  Clear Abdomen:  Soft, non-tender, non-distended Musculoskeletal:  No acute deformity or ROM limitation Skin:  Grossly intact  LABS:  BMET  Recent Labs Lab 05/28/16 1518  NA 139  K 4.1  CL 106  CO2 23  BUN 23*  CREATININE 1.31*  GLUCOSE 131*    Electrolytes  Recent Labs Lab 05/28/16 1518   CALCIUM 9.7    CBC  Recent Labs Lab 05/28/16 1518  WBC 6.3  HGB 11.6*  HCT 36.1*  PLT 280    Coag's  Recent Labs Lab 05/28/16 2006  INR 1.11    Sepsis Markers No results for input(s): LATICACIDVEN, PROCALCITON, O2SATVEN in the last 168 hours.  ABG No results for input(s): PHART, PCO2ART, PO2ART in the last 168 hours.  Liver Enzymes No results for input(s): AST, ALT, ALKPHOS, BILITOT, ALBUMIN in the last 168 hours.  Cardiac Enzymes No results for input(s): TROPONINI, PROBNP in the last 168 hours.  Glucose No results for input(s): GLUCAP in the last 168 hours.  Imaging Dg Chest 2 View  Result Date: 05/28/2016 CLINICAL DATA:  Syncope. EXAM: CHEST  2 VIEW COMPARISON:  04/08/2011 chest radiograph FINDINGS: The cardiomediastinal silhouette is unremarkable. There is no evidence of focal airspace disease, pulmonary edema, suspicious pulmonary nodule/mass, pleural effusion, or pneumothorax. No acute bony abnormalities are identified. Probable Paget's disease of the proximal right humerus again noted. IMPRESSION: No active cardiopulmonary disease. Electronically Signed   By: Harmon Pier M.D.   On: 05/28/2016 18:27   Ct Head Wo Contrast  Result Date: 05/28/2016 CLINICAL DATA:  Syncopal episode this morning. EXAM: CT HEAD WITHOUT CONTRAST TECHNIQUE: Contiguous axial images were obtained from the base of the skull through the vertex without intravenous contrast. COMPARISON:  None. FINDINGS: Brain: Large mixed attenuation bilateral subdural hematomas are seen. These measure 2.1 cm in maximum thickness on the right, and 1.8 cm in maximum thickness on the left. These result in mass effect on both cerebral hemispheres, with right to left midline shift measuring 8 mm. There is no evidence of intraparenchymal hemorrhage, brain edema or other sites of acute cerebral infarct. No evidence of hydrocephalus. Vascular: No hyperdense vessel or unexpected calcification. Skull: Normal. Negative for  fracture or focal lesion. Sinuses/Orbits: No acute finding. Other: None. IMPRESSION: Large bilateral acute to subacute subdural hematomas, with bilateral cerebral mass effect, and right to left midline shift measuring 8 mm. Critical Value/emergent results were called by telephone at the time of interpretation on 05/28/2016 at 7:49 pm to Dr. Tilden Fossa , who verbally acknowledged these results. Electronically Signed   By: Myles Rosenthal M.D.   On: 05/28/2016 19:52     STUDIES:  CT head 1/31 > Large bilateral acute to subacute subdural hematomas, with bilateral cerebral mass effect, and right to left midline shift measuring 8 mm.  CULTURES:   ANTIBIOTICS:   SIGNIFICANT EVENTS:   LINES/TUBES:   DISCUSSION: 83 year old with bilateral SDH s/p burr hole evacuation.   ASSESSMENT / PLAN:  Bilateral subacute SDH - Management per neurosurgery, surgical drains in place.  Syncope - unclear if caused by SDH or if caused by new med Namzaric (memantine HCL/donepezil HCL) which includes syncope in AE profile. And syncope caused SDH. EKG NSR with PAC -  Hold Namzaric  HTN - Telemetry monitoring - Continue home amlodipine once able to take PO - PRN hydralazine to keep SBP <  HLD - Continue home atorvastatin once able to take PO   AKI - Gentle IVF hydration and follow bmp/I&O  Anemia - follow CBC - SCDs for VTE ppx  Will monitor in ICU  Joneen Roach, AGACNP-BC [facility] Pulmonology/Critical Care Pager [phone] or [phone]  05/28/2016 11:35 PM

## 2016-05-28 NOTE — Transfer of Care (Signed)
Immediate Anesthesia Transfer of Care Note  Patient: Henry Harvey  Procedure(s) Performed: Procedure(s): BURR HOLES FOR SUBDURAL HEMATOMA (Bilateral)  Patient Location: PACU  Anesthesia Type:General  Level of Consciousness: awake and patient cooperative  Airway & Oxygen Therapy: Patient Spontanous Breathing and Patient connected to face mask oxygen  Post-op Assessment: Report given to RN and Post -op Vital signs reviewed and stable  Post vital signs: Reviewed and stable  Last Vitals:  Vitals:   05/28/16 2015 05/28/16 2255  BP: (!) 112/46 (!) 145/54  Pulse: 78 82  Resp:  (!) 21  Temp:  36.1 C    Last Pain:  Vitals:   05/28/16 1641  TempSrc:   PainSc: 0-No pain         Complications: No apparent anesthesia complications

## 2016-05-28 NOTE — ED Triage Notes (Signed)
Per family, pt experienced syncopal episode this morning. Pt states does not remember falling, woke up on floor. Pt denies any neck, chest or back pain. Pt denies any head injury/trauma. Pt denies any cough or fevers. No hx of same. Pt a/o x 4 at triage.

## 2016-05-28 NOTE — Progress Notes (Signed)
eLink Physician-Brief Progress Note Patient Name: Henry Harvey DOB: 08/06/1926 MRN: [id number]   Date of Service  05/28/2016  HPI/Events of Note  Large bilateral acute to subacute subdural hematomas, with bilateral cerebral mass effect, and right to left midline shift measuring 8 mm. s/p burr holes  eICU Interventions  PCCM asked to help manage Avoid secondary brain injury Patient to be extubated post op and sent to 31M     Intervention Category Evaluation Type: New Patient Evaluation  [name] 05/28/2016, 10:57 PM

## 2016-05-28 NOTE — H&P (Signed)
Henry Harvey is an 83 y.o. male.   Chief Complaint: Confusion HPI: Patient is an 83 year old gentleman with a history of confusion recently placed on medicine for dementia whose had a fall today underwent CT scan that showed bilateral subacute subdural hematomas. Patient reports no significant headache or nausea no significant past history.  History reviewed. No pertinent past medical history.  History reviewed. No pertinent surgical history.  History reviewed. No pertinent family history. Social History:  reports that he has never smoked. He has never used smokeless tobacco. He reports that he does not drink alcohol or use drugs.  Allergies:  Allergies  Allergen Reactions  . Penicillins Rash and Other (See Comments)    Has patient had a PCN reaction causing immediate rash, facial/tongue/throat swelling, SOB or lightheadedness with hypotension: Yes Has patient had a PCN reaction causing severe rash involving mucus membranes or skin necrosis: No Has patient had a PCN reaction that required hospitalization: No Has patient had a PCN reaction occurring within the last 10 years: No If all of the above answers are "NO", then may proceed with Cephalosporin use.     (Not in a hospital admission)  Results for orders placed or performed during the hospital encounter of 05/28/16 (from the past 48 hour(s))  Basic metabolic panel     Status: Abnormal   Collection Time: 05/28/16  3:18 PM  Result Value Ref Range   Sodium 139 135 - 145 mmol/L   Potassium 4.1 3.5 - 5.1 mmol/L   Chloride 106 101 - 111 mmol/L   CO2 23 22 - 32 mmol/L   Glucose, Bld 131 (H) 65 - 99 mg/dL   BUN 23 (H) 6 - 20 mg/dL   Creatinine, Ser 1.31 (H) 0.61 - 1.24 mg/dL   Calcium 9.7 8.9 - 10.3 mg/dL   GFR calc non Af Amer 47 (L) >60 mL/min   GFR calc Af Amer 55 (L) >60 mL/min    Comment: (NOTE) The eGFR has been calculated using the CKD EPI equation. This calculation has not been validated in all clinical situations. eGFR's  persistently <60 mL/min signify possible Chronic Kidney Disease.    Anion gap 10 5 - 15  CBC     Status: Abnormal   Collection Time: 05/28/16  3:18 PM  Result Value Ref Range   WBC 6.3 4.0 - 10.5 K/uL   RBC 4.09 (L) 4.22 - 5.81 MIL/uL   Hemoglobin 11.6 (L) 13.0 - 17.0 g/dL   HCT 36.1 (L) 39.0 - 52.0 %   MCV 88.3 78.0 - 100.0 fL   MCH 28.4 26.0 - 34.0 pg   MCHC 32.1 30.0 - 36.0 g/dL   RDW 13.9 11.5 - 15.5 %   Platelets 280 150 - 400 K/uL  Urinalysis, Routine w reflex microscopic     Status: Abnormal   Collection Time: 05/28/16  6:48 PM  Result Value Ref Range   Color, Urine YELLOW YELLOW   APPearance CLEAR CLEAR   Specific Gravity, Urine 1.019 1.005 - 1.030   pH 5.0 5.0 - 8.0   Glucose, UA NEGATIVE NEGATIVE mg/dL   Hgb urine dipstick SMALL (A) NEGATIVE   Bilirubin Urine NEGATIVE NEGATIVE   Ketones, ur NEGATIVE NEGATIVE mg/dL   Protein, ur NEGATIVE NEGATIVE mg/dL   Nitrite NEGATIVE NEGATIVE   Leukocytes, UA NEGATIVE NEGATIVE   RBC / HPF 6-30 0 - 5 RBC/hpf   WBC, UA 0-5 0 - 5 WBC/hpf   Bacteria, UA RARE (A) NONE SEEN   Squamous Epithelial /  LPF 0-5 (A) NONE SEEN   Mucous PRESENT    Dg Chest 2 View  Result Date: 05/28/2016 CLINICAL DATA:  Syncope. EXAM: CHEST  2 VIEW COMPARISON:  04/08/2011 chest radiograph FINDINGS: The cardiomediastinal silhouette is unremarkable. There is no evidence of focal airspace disease, pulmonary edema, suspicious pulmonary nodule/mass, pleural effusion, or pneumothorax. No acute bony abnormalities are identified. Probable Paget's disease of the proximal right humerus again noted. IMPRESSION: No active cardiopulmonary disease. Electronically Signed   By: Margarette Canada M.D.   On: 05/28/2016 18:27   Ct Head Wo Contrast  Result Date: 05/28/2016 CLINICAL DATA:  Syncopal episode this morning. EXAM: CT HEAD WITHOUT CONTRAST TECHNIQUE: Contiguous axial images were obtained from the base of the skull through the vertex without intravenous contrast. COMPARISON:   None. FINDINGS: Brain: Large mixed attenuation bilateral subdural hematomas are seen. These measure 2.1 cm in maximum thickness on the right, and 1.8 cm in maximum thickness on the left. These result in mass effect on both cerebral hemispheres, with right to left midline shift measuring 8 mm. There is no evidence of intraparenchymal hemorrhage, brain edema or other sites of acute cerebral infarct. No evidence of hydrocephalus. Vascular: No hyperdense vessel or unexpected calcification. Skull: Normal. Negative for fracture or focal lesion. Sinuses/Orbits: No acute finding. Other: None. IMPRESSION: Large bilateral acute to subacute subdural hematomas, with bilateral cerebral mass effect, and right to left midline shift measuring 8 mm. Critical Value/emergent results were called by telephone at the time of interpretation on 05/28/2016 at 7:49 pm to Dr. Quintella Reichert , who verbally acknowledged these results. Electronically Signed   By: Earle Gell M.D.   On: 05/28/2016 19:52    Review of Systems  Neurological: Positive for headaches.    Blood pressure (!) 126/104, pulse 99, temperature 98.1 F (36.7 C), temperature source Oral, resp. rate 16, SpO2 97 %. Physical Exam  Constitutional: He appears well-developed and well-nourished.  HENT:  Head: Normocephalic.  Eyes: Pupils are equal, round, and reactive to light.  Neck: Normal range of motion.  Cardiovascular: Normal rate.   Respiratory: Effort normal.  GI: Soft.  Musculoskeletal: Normal range of motion.  Neurological: He is alert. He has normal strength. GCS eye subscore is 4. GCS verbal subscore is 5. GCS motor subscore is 6.  Patient is awake and alert pupils are equal Cranial nerves are intact strength is 5 out of 5 upper and lower extremities with no pronator drift.  Skin: Skin is warm and dry.     Assessment/Plan 83 year old bilateral subacute subdural hematomas. I recommended bilateral bur holes for evacuation with drain placement. I'm  currently trying to contact family and we will proceed once we contact family.  Inna Tisdell P, MD 05/28/2016, 8:28 PM

## 2016-05-28 NOTE — ED Notes (Signed)
Pt. signed consent form for his brain surgery by Dr. Wynetta Emeryram .

## 2016-05-28 NOTE — ED Provider Notes (Signed)
[facility]-EMERGENCY DEPT Provider Note   CSN: [id number] Arrival date & time: 05/28/16  1456     History   Chief Complaint Chief Complaint  Patient presents with  . Loss of Consciousness    HPI Henry Harvey is a 83 y.o. male.  The history is provided by the patient and a caregiver. No language interpreter was used.  Loss of Consciousness      Henry Harvey is a 83 y.o. male who presents to the Emergency Department complaining of syncope.  She presents from home for evaluation of syncope. History is provided by his caregiver and his nephew. He does not recall the events. Per family he was sitting at the table this morning, getting ready to eat breakfast when he had a brief syncopal episode, falling out of the chair. No injuries were sustained in the syncopal event. There was no seizure activity. Family state he has been more unsteady since this occurred. He recently started a new medication 3 days ago. Patient denies any complaints but does not recall event. He denies any headache, chest pain, shortness breath, bowel pain, nausea, vomiting. He currently lives alone but there are family members that do help him at home. He did vomit once when he syncopized. History reviewed. No pertinent past medical history.  Patient Active Problem Harvey   Diagnosis Date Noted  . SDH (subdural hematoma) (HCC) 05/28/2016  . Subdural hematoma (HCC) 05/28/2016    History reviewed. No pertinent surgical history.     Home Medications    Prior to Admission medications   Medication Sig Start Date End Date Taking? Authorizing Provider  amLODipine (NORVASC) 2.5 MG tablet Take 2.5 mg by mouth daily.   Yes Historical Provider, MD  aspirin 81 MG EC tablet Take 81 mg by mouth daily. 04/26/16  Yes Historical Provider, MD  atorvastatin (LIPITOR) 10 MG tablet Take 10 mg by mouth daily.     Yes Historical Provider, MD  dorzolamide-timolol (COSOPT) 22.3-6.8 MG/ML ophthalmic solution Place 1 drop into the right eye  2 (two) times daily.   Yes Historical Provider, MD  ENSURE (ENSURE) Take 237 mLs by mouth 2 (two) times daily between meals.   Yes Historical Provider, MD  latanoprost (XALATAN) 0.005 % ophthalmic solution Place 1 drop into both eyes at bedtime.   Yes Historical Provider, MD  Memantine HCl-Donepezil HCl (NAMZARIC) 7 & 14 & 21 &28 -10 MG C4PK Take 7-28 mg by mouth See admin instructions. 7 mg/10 mg once a day during week 1; 14 mg/10 mg once a day during week 2, 21 mg/10 mg once a day during week 3; 28 mg/10 mg once a day during week 4   Yes Historical Provider, MD  nitroGLYCERIN (NITROSTAT) 0.4 MG SL tablet Place 0.4 mg under the tongue every 5 (five) minutes as needed for chest pain.   Yes Historical Provider, MD  albuterol (PROVENTIL HFA;VENTOLIN HFA) 108 (90 BASE) MCG/ACT inhaler Inhale 2 puffs into the lungs every 4 (four) hours as needed for wheezing or shortness of breath. 04/08/11 04/07/12  Eber HongBrian Miller, MD  ciprofloxacin (CIPRO) 500 MG tablet Take 1 tablet (500 mg total) by mouth every 12 (twelve) hours. Patient not taking: Reported on 05/28/2016 02/27/16   Shon Batonourtney F Horton, MD  metroNIDAZOLE (FLAGYL) 500 MG tablet Take 1 tablet (500 mg total) by mouth 2 (two) times daily. Patient not taking: Reported on 05/28/2016 02/27/16   Shon Batonourtney F Horton, MD  nitroGLYCERIN (NITRODUR - DOSED IN MG/24 HR) 0.2 mg/hr patch Place 0.2 mg  onto the skin daily.     Historical Provider, MD  ondansetron (ZOFRAN ODT) 4 MG disintegrating tablet Take 1 tablet (4 mg total) by mouth every 8 (eight) hours as needed for nausea or vomiting. Patient not taking: Reported on 05/28/2016 02/27/16   Shon Baton, MD    Family History History reviewed. No pertinent family history.  Social History Social History  Substance Use Topics  . Smoking status: Never Smoker  . Smokeless tobacco: Never Used  . Alcohol use No     Allergies   Penicillins   Review of Systems Review of Systems  Cardiovascular: Positive for  syncope.  All other systems reviewed and are negative.    Physical Exam Updated Vital Signs BP (!) 148/70   Pulse 68   Temp 97.3 F (36.3 C)   Resp 16   SpO2 100%   Physical Exam  Constitutional: He appears well-developed and well-nourished.  HENT:  Head: Normocephalic and atraumatic.  Eyes: Pupils are equal, round, and reactive to light.  Cardiovascular: Normal rate and regular rhythm.   No murmur heard. Pulmonary/Chest: Effort normal and breath sounds normal. No respiratory distress.  Abdominal: Soft. There is no tenderness. There is no rebound and no guarding.  Musculoskeletal: He exhibits no edema or tenderness.  Neurological: He is alert.  Disoriented to time. 5 out of 5 strength in all 4 extremities.  Skin: Skin is warm and dry.  Psychiatric: He has a normal mood and affect. His behavior is normal.  Nursing note and vitals reviewed.    ED Treatments / Results  Labs (all labs ordered are listed, but only abnormal results are displayed) Labs Reviewed  BASIC METABOLIC PANEL - Abnormal; Notable for the following:       Result Value   Glucose, Bld 131 (*)    BUN 23 (*)    Creatinine, Ser 1.31 (*)    GFR calc non Af Amer 47 (*)    GFR calc Af Amer 55 (*)    All other components within normal limits  CBC - Abnormal; Notable for the following:    RBC 4.09 (*)    Hemoglobin 11.6 (*)    HCT 36.1 (*)    All other components within normal limits  URINALYSIS, ROUTINE W REFLEX MICROSCOPIC - Abnormal; Notable for the following:    Hgb urine dipstick SMALL (*)    Bacteria, UA RARE (*)    Squamous Epithelial / LPF 0-5 (*)    All other components within normal limits  BASIC METABOLIC PANEL - Abnormal; Notable for the following:    Glucose, Bld 135 (*)    Calcium 8.5 (*)    All other components within normal limits  CBC - Abnormal; Notable for the following:    RBC 3.83 (*)    Hemoglobin 10.6 (*)    HCT 33.5 (*)    All other components within normal limits  MRSA PCR  SCREENING  PROTIME-INR  BASIC METABOLIC PANEL  CBG MONITORING, ED    EKG  EKG Interpretation  Date/Time:  Wednesday May 28 2016 16:54:57 EST Ventricular Rate:  65 PR Interval:  132 QRS Duration: 87 QT Interval:  405 QTC Calculation: 422 R Axis:   80 Text Interpretation:  Sinus rhythm Atrial premature complex Right atrial enlargement Confirmed by Lincoln Brigham [phone]) on 05/28/2016 4:59:49 PM       Radiology Dg Chest 2 View  Result Date: 05/28/2016 CLINICAL DATA:  Syncope. EXAM: CHEST  2 VIEW COMPARISON:  04/08/2011 chest radiograph  FINDINGS: The cardiomediastinal silhouette is unremarkable. There is no evidence of focal airspace disease, pulmonary edema, suspicious pulmonary nodule/mass, pleural effusion, or pneumothorax. No acute bony abnormalities are identified. Probable Paget's disease of the proximal right humerus again noted. IMPRESSION: No active cardiopulmonary disease. Electronically Signed   By: Harmon Pier M.D.   On: 05/28/2016 18:27   Ct Head Wo Contrast  Result Date: 05/28/2016 CLINICAL DATA:  Syncopal episode this morning. EXAM: CT HEAD WITHOUT CONTRAST TECHNIQUE: Contiguous axial images were obtained from the base of the skull through the vertex without intravenous contrast. COMPARISON:  None. FINDINGS: Brain: Large mixed attenuation bilateral subdural hematomas are seen. These measure 2.1 cm in maximum thickness on the right, and 1.8 cm in maximum thickness on the left. These result in mass effect on both cerebral hemispheres, with right to left midline shift measuring 8 mm. There is no evidence of intraparenchymal hemorrhage, brain edema or other sites of acute cerebral infarct. No evidence of hydrocephalus. Vascular: No hyperdense vessel or unexpected calcification. Skull: Normal. Negative for fracture or focal lesion. Sinuses/Orbits: No acute finding. Other: None. IMPRESSION: Large bilateral acute to subacute subdural hematomas, with bilateral cerebral mass effect, and  right to left midline shift measuring 8 mm. Critical Value/emergent results were called by telephone at the time of interpretation on 05/28/2016 at 7:49 pm to Dr. Tilden Fossa , who verbally acknowledged these results. Electronically Signed   By: Myles Rosenthal M.D.   On: 05/28/2016 19:52    Procedures Procedures (including critical care time)  Medications Ordered in ED Medications  0.9 % NaCl with KCl 20 mEq/ L  infusion ( Intravenous New Bag/Given 05/28/16 2357)  ceFAZolin (ANCEF) IVPB 2g/100 mL premix (not administered)  docusate sodium (COLACE) capsule 100 mg (0 mg Oral Hold 05/29/16 0030)  ondansetron (ZOFRAN) tablet 4 mg (not administered)    Or  ondansetron (ZOFRAN) injection 4 mg (not administered)  promethazine (PHENERGAN) tablet 12.5-25 mg (not administered)  dexamethasone (DECADRON) injection 6 mg (not administered)    Followed by  dexamethasone (DECADRON) injection 4 mg (not administered)    Followed by  dexamethasone (DECADRON) injection 4 mg (not administered)  labetalol (NORMODYNE,TRANDATE) injection 10-40 mg (not administered)  levETIRAcetam (KEPPRA) 500 mg in sodium chloride 0.9 % 100 mL IVPB (not administered)  pantoprazole (PROTONIX) injection 40 mg (not administered)  hydrALAZINE (APRESOLINE) injection 5-20 mg (not administered)  levETIRAcetam (KEPPRA) 500 mg in sodium chloride 0.9 % 100 mL IVPB (not administered)  sodium chloride 0.9 % bolus 500 mL (0 mLs Intravenous Stopped 05/28/16 1927)     Initial Impression / Assessment and Plan / ED Course  I have reviewed the triage vital signs and the nursing notes.  Pertinent labs & imaging results that were available during my care of the patient were reviewed by me and considered in my medical decision making (see chart for details).     Patient here following syncopal event with amnesia to the events. He is ataxic and amnestic in the emergency department. CT head demonstrates bilateral subdural hematomas with evidence of  midline shift. Dr. Wynetta Emery with neurosurgery consulted and he evaluated the patient in the emergency department. Plan to admit for surgical treatment. Patient and family updated findings of studies recommendation of admission for further treatment and they are in agreement with plan.  Final Clinical Impressions(s) / ED Diagnoses   Final diagnoses:  SDH (subdural hematoma) [facility])    New Prescriptions Current Discharge Medication Harvey       Tilden Fossa,  MD 05/29/16 0157

## 2016-05-28 NOTE — ED Notes (Signed)
Transported to OR in stable condition

## 2016-05-28 NOTE — Anesthesia Preprocedure Evaluation (Addendum)
Anesthesia Evaluation  Patient identified by MRN, date of birth, ID band Patient awake    Reviewed: Allergy & Precautions, NPO status , Patient's Chart, lab work & pertinent test results  History of Anesthesia Complications Negative for: history of anesthetic complications  Airway Mallampati: II  TM Distance: >3 FB Neck ROM: Full    Dental  (+) Poor Dentition, Missing   Pulmonary neg pulmonary ROS,    breath sounds clear to auscultation       Cardiovascular hypertension, Pt. on medications + angina  Rhythm:Regular     Neuro/Psych B/l subdural negative psych ROS   GI/Hepatic negative GI ROS, Neg liver ROS,   Endo/Other  negative endocrine ROS  Renal/GU negative Renal ROS     Musculoskeletal   Abdominal   Peds  Hematology negative hematology ROS (+)   Anesthesia Other Findings   Reproductive/Obstetrics                            Anesthesia Physical Anesthesia Plan  ASA: III and emergent  Anesthesia Plan: General   Post-op Pain Management:    Induction: Intravenous, Cricoid pressure planned and Rapid sequence  Airway Management Planned: Oral ETT  Additional Equipment: None  Intra-op Plan:   Post-operative Plan: Extubation in OR  Informed Consent: I have reviewed the patients History and Physical, chart, labs and discussed the procedure including the risks, benefits and alternatives for the proposed anesthesia with the patient or authorized representative who has indicated his/her understanding and acceptance.   Dental advisory given  Plan Discussed with: CRNA and Surgeon  Anesthesia Plan Comments:         Anesthesia Quick Evaluation

## 2016-05-28 NOTE — Anesthesia Procedure Notes (Signed)
Procedure Name: Intubation Date/Time: 05/28/2016 9:35 PM Performed by: Hollie Salk Z Pre-anesthesia Checklist: Patient identified, Emergency Drugs available, Suction available and Patient being monitored Patient Re-evaluated:Patient Re-evaluated prior to inductionOxygen Delivery Method: Circle System Utilized Preoxygenation: Pre-oxygenation with 100% oxygen Intubation Type: IV induction and Cricoid Pressure applied Ventilation: Mask ventilation without difficulty Laryngoscope Size: Mac and 3 Grade View: Grade I Tube type: Oral Number of attempts: 1 Airway Equipment and Method: Stylet and Oral airway Placement Confirmation: ETT inserted through vocal cords under direct vision,  positive ETCO2 and breath sounds checked- equal and bilateral Secured at: 22 cm Tube secured with: Tape Dental Injury: Teeth and Oropharynx as per pre-operative assessment

## 2016-05-28 NOTE — Op Note (Signed)
Preoperative diagnosis: Bilateral subacute subdural hematomas  Postoperative diagnosis: Same  Procedure: Bilateral bur hole craniectomy for evacuation of bilateral subacute subdural hematomas  Surgeon: Jillyn HiddenGary Kaelob Persky  Anesthesia: Gen.  EBL: Minimal  History of present illness: Patient is a very pleasant 83 year old gentleman who's had a fall this morning unknown any previous falls and some progressive confusion over the last few days patient came to the ER CT scan revealed bilateral subacute subdural hematomas due to patient's progression of clinical syndrome and imaging findings and recommended bilateral burr hole craniectomy for evacuation. I extensively reviewed the risks and benefits of the operation with the patient and his nephew as well as perioperative course expectations of outcome and alternatives of surgery and he understood and agreed to proceed forward.  Operative procedure: Patient brought into the or was induced on general anesthesia positioned supine the head pointing straight up in slight flexion and in pins both sides of his head along the superotemporal line was prepped and draped in routine sterile fashion for incisions were drawn out to the frontal and 2 parietal both injected with lidocaine with epi both incised and L4 bur holes were drilled the dura was coagulated and incised in a cruciate fashion overall 4 holes the a subdural member was immediate identified this was opened up I was able to visualize cortex through all 4 holes I copiously irrigated old appearing dark blood consistent with a subacute subdural hematoma out all holes using a red rubber catheter to sure that I got all the hematoma out irrigated until had clear irrigant and all 4 holes. Then I sequentially closed him passing a J-P drain over a 3 [place] from the parietal hole towards the frontal hole. Then after all the drains in place to close the posterior parietal holes I closed the frontal holes while injecting  irrigant to remove as much air as I could. Closed with interrupted Vicryl and staples in the skin patient was then expense and recovery in stable condition. At the end the case all needle counts sponge counts were correct.

## 2016-05-29 ENCOUNTER — Encounter (HOSPITAL_COMMUNITY): Payer: Self-pay | Admitting: Pulmonary Disease

## 2016-05-29 ENCOUNTER — Inpatient Hospital Stay (HOSPITAL_COMMUNITY): Payer: Medicare HMO

## 2016-05-29 DIAGNOSIS — I6200 Nontraumatic subdural hemorrhage, unspecified: Secondary | ICD-10-CM

## 2016-05-29 DIAGNOSIS — F0390 Unspecified dementia without behavioral disturbance: Secondary | ICD-10-CM

## 2016-05-29 DIAGNOSIS — R739 Hyperglycemia, unspecified: Secondary | ICD-10-CM

## 2016-05-29 DIAGNOSIS — I10 Essential (primary) hypertension: Secondary | ICD-10-CM

## 2016-05-29 DIAGNOSIS — E785 Hyperlipidemia, unspecified: Secondary | ICD-10-CM

## 2016-05-29 DIAGNOSIS — R55 Syncope and collapse: Secondary | ICD-10-CM

## 2016-05-29 DIAGNOSIS — D62 Acute posthemorrhagic anemia: Secondary | ICD-10-CM

## 2016-05-29 LAB — BASIC METABOLIC PANEL
ANION GAP: 10 (ref 5–15)
ANION GAP: 6 (ref 5–15)
BUN: 18 mg/dL (ref 6–20)
BUN: 20 mg/dL (ref 6–20)
CALCIUM: 8.5 mg/dL — AB (ref 8.9–10.3)
CALCIUM: 8.8 mg/dL — AB (ref 8.9–10.3)
CO2: 19 mmol/L — ABNORMAL LOW (ref 22–32)
CO2: 23 mmol/L (ref 22–32)
CREATININE: 1.07 mg/dL (ref 0.61–1.24)
Chloride: 108 mmol/L (ref 101–111)
Chloride: 110 mmol/L (ref 101–111)
Creatinine, Ser: 1 mg/dL (ref 0.61–1.24)
GLUCOSE: 155 mg/dL — AB (ref 65–99)
Glucose, Bld: 135 mg/dL — ABNORMAL HIGH (ref 65–99)
Potassium: 3.6 mmol/L (ref 3.5–5.1)
Potassium: 4.5 mmol/L (ref 3.5–5.1)
SODIUM: 137 mmol/L (ref 135–145)
SODIUM: 139 mmol/L (ref 135–145)

## 2016-05-29 LAB — MRSA PCR SCREENING: MRSA BY PCR: NEGATIVE

## 2016-05-29 LAB — CBC
HCT: 33.5 % — ABNORMAL LOW (ref 39.0–52.0)
HCT: 33.6 % — ABNORMAL LOW (ref 39.0–52.0)
HEMOGLOBIN: 10.8 g/dL — AB (ref 13.0–17.0)
HEMOGLOBIN: 10.6 g/dL — AB (ref 13.0–17.0)
MCH: 28.2 pg (ref 26.0–34.0)
MCH: 27.7 pg (ref 26.0–34.0)
MCHC: 31.6 g/dL (ref 30.0–36.0)
MCHC: 32.1 g/dL (ref 30.0–36.0)
MCV: 87.5 fL (ref 78.0–100.0)
MCV: 87.7 fL (ref 78.0–100.0)
PLATELETS: 237 10*3/uL (ref 150–400)
Platelets: 246 10*3/uL (ref 150–400)
RBC: 3.83 MIL/uL — ABNORMAL LOW (ref 4.22–5.81)
RBC: 3.83 MIL/uL — AB (ref 4.22–5.81)
RDW: 13.6 % (ref 11.5–15.5)
RDW: 13.8 % (ref 11.5–15.5)
WBC: 7.8 10*3/uL (ref 4.0–10.5)
WBC: 6.1 10*3/uL (ref 4.0–10.5)

## 2016-05-29 MED ORDER — ONDANSETRON HCL 4 MG/2ML IJ SOLN: 4.0000 mg | INTRAMUSCULAR | Status: DC | PRN

## 2016-05-29 MED ORDER — DEXAMETHASONE SODIUM PHOSPHATE 4 MG/ML IJ SOLN
4.0000 mg | Freq: Four times a day (QID) | INTRAMUSCULAR | Status: AC
  Administered 2016-05-30 (×4): 4 mg via INTRAVENOUS
  Filled 2016-05-29 (×4): qty 1

## 2016-05-29 MED ORDER — LEVETIRACETAM 500 MG/5ML IV SOLN
500.0000 mg | Freq: Two times a day (BID) | INTRAVENOUS | Status: DC
  Administered 2016-05-29 – 2016-06-02 (×9): 500 mg via INTRAVENOUS
  Filled 2016-05-29 (×9): qty 5

## 2016-05-29 MED ORDER — DEXAMETHASONE SODIUM PHOSPHATE 10 MG/ML IJ SOLN
6.0000 mg | Freq: Four times a day (QID) | INTRAMUSCULAR | Status: AC
  Administered 2016-05-29 (×4): 6 mg via INTRAVENOUS
  Filled 2016-05-29 (×4): qty 1

## 2016-05-29 MED ORDER — PANTOPRAZOLE SODIUM 40 MG IV SOLR
40.0000 mg | Freq: Every day | INTRAVENOUS | Status: DC
  Administered 2016-05-29 – 2016-05-30 (×3): 40 mg via INTRAVENOUS
  Filled 2016-05-29 (×3): qty 40

## 2016-05-29 MED ORDER — CEFAZOLIN SODIUM-DEXTROSE 2-4 GM/100ML-% IV SOLN
2.0000 g | Freq: Three times a day (TID) | INTRAVENOUS | Status: AC
  Administered 2016-05-29 (×2): 2 g via INTRAVENOUS
  Filled 2016-05-29 (×2): qty 100

## 2016-05-29 MED ORDER — SODIUM CHLORIDE 0.9 % IV SOLN
500.0000 mg | Freq: Once | INTRAVENOUS | Status: AC
  Administered 2016-05-29: 500 mg via INTRAVENOUS
  Filled 2016-05-29: qty 5

## 2016-05-29 MED ORDER — LABETALOL HCL 5 MG/ML IV SOLN: 10.0000 mg | INTRAVENOUS | Status: DC | PRN

## 2016-05-29 MED ORDER — DOCUSATE SODIUM 100 MG PO CAPS
100.0000 mg | ORAL_CAPSULE | Freq: Two times a day (BID) | ORAL | Status: DC
  Administered 2016-05-29 – 2016-06-03 (×11): 100 mg via ORAL
  Filled 2016-05-29 (×11): qty 1

## 2016-05-29 MED ORDER — PROMETHAZINE HCL 25 MG PO TABS: 12.5000 mg | ORAL_TABLET | ORAL | Status: DC | PRN

## 2016-05-29 MED ORDER — ONDANSETRON HCL 4 MG PO TABS: 4.0000 mg | ORAL_TABLET | ORAL | Status: DC | PRN

## 2016-05-29 MED ORDER — DEXAMETHASONE SODIUM PHOSPHATE 4 MG/ML IJ SOLN
4.0000 mg | Freq: Three times a day (TID) | INTRAMUSCULAR | Status: DC
  Administered 2016-05-30 – 2016-06-03 (×11): 4 mg via INTRAVENOUS
  Filled 2016-05-29 (×11): qty 1

## 2016-05-29 NOTE — Anesthesia Postprocedure Evaluation (Addendum)
Anesthesia Post Note  Patient: Henry Harvey  Procedure(s) Performed: Procedure(s) (LRB): BURR HOLES FOR SUBDURAL HEMATOMA (Bilateral)  Patient location during evaluation: PACU Anesthesia Type: General Level of consciousness: awake Pain management: pain level controlled Vital Signs Assessment: post-procedure vital signs reviewed and stable Respiratory status: spontaneous breathing, nonlabored ventilation, respiratory function stable and patient connected to nasal cannula oxygen Cardiovascular status: blood pressure returned to baseline and stable Postop Assessment: no signs of nausea or vomiting Anesthetic complications: no       Last Vitals:  Vitals:   05/29/16 0600 05/29/16 0700  BP: 116/68 114/63  Pulse: 80 72  Resp: 19 13  Temp:      Last Pain:  Vitals:   05/29/16 0400  TempSrc: Oral  PainSc:                  [name]

## 2016-05-29 NOTE — Progress Notes (Signed)
Dr. Wynetta Emeryram notified regarding patient pulling out L JP drain. The site was covered with gauze and pressure tape. R drain partially intact. Pt pulled right drain slightly out, but I was able to regain charge on the bulb and it is now at suction again with a new dressing in place. Pt remains A+Ox3, VSS, MAE. Will continue to monitor. CT scan scheduled for 0615.

## 2016-05-29 NOTE — Progress Notes (Signed)
Subjective: Patient reports Patient well no headache no nausea  Objective: Vital signs in last 24 hours: Temp:  [97 F (36.1 C)-98.1 F (36.7 C)] 97.4 F (36.3 C) (02/01 0800) Pulse Rate:  [61-99] 77 (02/01 0900) Resp:  [13-31] 17 (02/01 1000) BP: (97-162)/(42-120) 138/60 (02/01 1000) SpO2:  [91 %-100 %] 93 % (02/01 0900) Weight:  [50.2 kg (110 lb 10.7 oz)] 50.2 kg (110 lb 10.7 oz) (02/01 0014)  Intake/Output from previous day: 01/31 0701 - 02/01 0700 In: 2050.8 [I.V.:1778.8; IV Piggyback:100] Out: 420 [Urine:250; Drains:95; Blood:75] Intake/Output this shift: Total I/O In: 325 [I.V.:225; IV Piggyback:100] Out: -   Awake alert oriented pupils equal strength 5 out of 5  Lab Results:  Recent Labs  05/28/16 2347 05/29/16 0741  WBC 6.1 7.8  HGB 10.6* 10.8*  HCT 33.5* 33.6*  PLT 237 246   BMET  Recent Labs  05/28/16 2347 05/29/16 0741  NA 139 137  K 3.6 4.5  CL 110 108  CO2 23 19*  GLUCOSE 135* 155*  BUN 20 18  CREATININE 1.07 1.00  CALCIUM 8.5* 8.8*    Studies/Results: Dg Chest 2 View  Result Date: 05/28/2016 CLINICAL DATA:  Syncope. EXAM: CHEST  2 VIEW COMPARISON:  04/08/2011 chest radiograph FINDINGS: The cardiomediastinal silhouette is unremarkable. There is no evidence of focal airspace disease, pulmonary edema, suspicious pulmonary nodule/mass, pleural effusion, or pneumothorax. No acute bony abnormalities are identified. Probable Paget's disease of the proximal right humerus again noted. IMPRESSION: No active cardiopulmonary disease. Electronically Signed   By: Harmon PierJeffrey  Hu M.D.   On: 05/28/2016 18:27   Ct Head Wo Contrast  Result Date: 05/29/2016 CLINICAL DATA:  Followup subdural evacuation is EXAM: CT HEAD WITHOUT CONTRAST TECHNIQUE: Contiguous axial images were obtained from the base of the skull through the vertex without intravenous contrast. COMPARISON:  05/28/2016 FINDINGS: Brain: Status post bilateral bur holes for evacuation of large bilateral  subdural hematomas. Subdural drain in place on the right but apparently not on the left. Marked reduction in subdural collection thickness and mass effect upon the brain. Anteriorly, subdural fluid and air measures 15 mm on the right and 11 mm on the left. Posteriorly, mixed density subdural hematoma measures 8 mm on the right and 11 mm on the left. Much less mass-effect upon the brain. Right-to-left shift of 2 mm. No sign of ischemic infarction. No hydrocephalus. Vascular: There is atherosclerotic calcification of the major vessels at the base of the brain. Skull: Burr holes as noted above. Sinuses/Orbits: Chronic inflammatory changes of the sphenoid sinus. Orbits negative. Other: Subluxation at C1-2 with spinal stenosis. IMPRESSION: Bilateral subdural evacuation with much less mass effect upon the brain. See above discussion. No complicating feature. Subdural fluid, air and blood as expected. Drain in place on the right but not apparently on the left. Electronically Signed   By: Paulina FusiMark  Shogry M.D.   On: 05/29/2016 07:56   Ct Head Wo Contrast  Result Date: 05/28/2016 CLINICAL DATA:  Syncopal episode this morning. EXAM: CT HEAD WITHOUT CONTRAST TECHNIQUE: Contiguous axial images were obtained from the base of the skull through the vertex without intravenous contrast. COMPARISON:  None. FINDINGS: Brain: Large mixed attenuation bilateral subdural hematomas are seen. These measure 2.1 cm in maximum thickness on the right, and 1.8 cm in maximum thickness on the left. These result in mass effect on both cerebral hemispheres, with right to left midline shift measuring 8 mm. There is no evidence of intraparenchymal hemorrhage, brain edema or other sites of  acute cerebral infarct. No evidence of hydrocephalus. Vascular: No hyperdense vessel or unexpected calcification. Skull: Normal. Negative for fracture or focal lesion. Sinuses/Orbits: No acute finding. Other: None. IMPRESSION: Large bilateral acute to subacute  subdural hematomas, with bilateral cerebral mass effect, and right to left midline shift measuring 8 mm. Critical Value/emergent results were called by telephone at the time of interpretation on 05/28/2016 at 7:49 pm to Dr. Tilden Fossa , who verbally acknowledged these results. Electronically Signed   By: Myles Rosenthal M.D.   On: 05/28/2016 19:52    Assessment/Plan: 83 year old gentleman status post bur holes bilaterally for chronic subdural hematomas. Patient doing very well CT scan shows significant proven to some pneumocephalus remaining on the right right-sided J-P drain still holding suction will continue for another 24 hours left-sided J-P drain was removed by the patient.  LOS: 1 day     Tashai Catino P 05/29/2016, 10:22 AM

## 2016-05-30 NOTE — Progress Notes (Signed)
Subjective: Patient reports Doing well no headache  Objective: Vital signs in last 24 hours: Temp:  [97.3 F (36.3 C)-97.6 F (36.4 C)] 97.6 F (36.4 C) (02/02 0400) Pulse Rate:  [54-95] 70 (02/02 0700) Resp:  [13-24] 18 (02/02 0700) BP: (89-156)/(52-113) 156/82 (02/02 0700) SpO2:  [85 %-100 %] 98 % (02/02 0700)  Intake/Output from previous day: 02/01 0701 - 02/02 0700 In: 2110 [I.V.:1800; IV Piggyback:310] Out: 1590 [Urine:1350; Drains:240] Intake/Output this shift: No intake/output data recorded.  Awake alert oriented strength 5 out of 5  Lab Results:  Recent Labs  05/28/16 2347 05/29/16 0741  WBC 6.1 7.8  HGB 10.6* 10.8*  HCT 33.5* 33.6*  PLT 237 246   BMET  Recent Labs  05/28/16 2347 05/29/16 0741  NA 139 137  K 3.6 4.5  CL 110 108  CO2 23 19*  GLUCOSE 135* 155*  BUN 20 18  CREATININE 1.07 1.00  CALCIUM 8.5* 8.8*    Studies/Results: Dg Chest 2 View  Result Date: 05/28/2016 CLINICAL DATA:  Syncope. EXAM: CHEST  2 VIEW COMPARISON:  04/08/2011 chest radiograph FINDINGS: The cardiomediastinal silhouette is unremarkable. There is no evidence of focal airspace disease, pulmonary edema, suspicious pulmonary nodule/mass, pleural effusion, or pneumothorax. No acute bony abnormalities are identified. Probable Paget's disease of the proximal right humerus again noted. IMPRESSION: No active cardiopulmonary disease. Electronically Signed   By: Harmon PierJeffrey  Hu M.D.   On: 05/28/2016 18:27   Ct Head Wo Contrast  Result Date: 05/29/2016 CLINICAL DATA:  Followup subdural evacuation is EXAM: CT HEAD WITHOUT CONTRAST TECHNIQUE: Contiguous axial images were obtained from the base of the skull through the vertex without intravenous contrast. COMPARISON:  05/28/2016 FINDINGS: Brain: Status post bilateral bur holes for evacuation of large bilateral subdural hematomas. Subdural drain in place on the right but apparently not on the left. Marked reduction in subdural collection thickness  and mass effect upon the brain. Anteriorly, subdural fluid and air measures 15 mm on the right and 11 mm on the left. Posteriorly, mixed density subdural hematoma measures 8 mm on the right and 11 mm on the left. Much less mass-effect upon the brain. Right-to-left shift of 2 mm. No sign of ischemic infarction. No hydrocephalus. Vascular: There is atherosclerotic calcification of the major vessels at the base of the brain. Skull: Burr holes as noted above. Sinuses/Orbits: Chronic inflammatory changes of the sphenoid sinus. Orbits negative. Other: Subluxation at C1-2 with spinal stenosis. IMPRESSION: Bilateral subdural evacuation with much less mass effect upon the brain. See above discussion. No complicating feature. Subdural fluid, air and blood as expected. Drain in place on the right but not apparently on the left. Electronically Signed   By: Paulina FusiMark  Shogry M.D.   On: 05/29/2016 07:56   Ct Head Wo Contrast  Result Date: 05/28/2016 CLINICAL DATA:  Syncopal episode this morning. EXAM: CT HEAD WITHOUT CONTRAST TECHNIQUE: Contiguous axial images were obtained from the base of the skull through the vertex without intravenous contrast. COMPARISON:  None. FINDINGS: Brain: Large mixed attenuation bilateral subdural hematomas are seen. These measure 2.1 cm in maximum thickness on the right, and 1.8 cm in maximum thickness on the left. These result in mass effect on both cerebral hemispheres, with right to left midline shift measuring 8 mm. There is no evidence of intraparenchymal hemorrhage, brain edema or other sites of acute cerebral infarct. No evidence of hydrocephalus. Vascular: No hyperdense vessel or unexpected calcification. Skull: Normal. Negative for fracture or focal lesion. Sinuses/Orbits: No acute finding. Other:  None. IMPRESSION: Large bilateral acute to subacute subdural hematomas, with bilateral cerebral mass effect, and right to left midline shift measuring 8 mm. Critical Value/emergent results were  called by telephone at the time of interpretation on 05/28/2016 at 7:49 pm to Dr. Tilden Fossa , who verbally acknowledged these results. Electronically Signed   By: Myles Rosenthal M.D.   On: 05/28/2016 19:52    Assessment/Plan: Posterior day to bilateral bur hole for subacute subdural hematoma doing well significant improvement neurologically. JP drain putting out what appears to be a mixture of CSF and small amount of blood products so I discontinued it. We'll continue to observe this morning transferred to floor later this afternoon.  LOS: 2 days     Asani Mcburney P 05/30/2016, 7:45 AM

## 2016-05-30 NOTE — Care Management Note (Signed)
Case Management Note  Patient Details  Name: Pauline Goodzra Viswanathan MRN: [id number] Date of Birth: 07/30/1926  Subjective/Objective:  Pt admitted on 05/28/16 s/p fall with bilateral subacute subdural hematomas.  PTA, pt independent, lives at home; pt has supportive daughter.                   Action/Plan: Recommend PT consult secondary to fall at home; will follow for recommendations.    Expected Discharge Date:                  Expected Discharge Plan:  Home w Home Health Services  In-House Referral:     Discharge planning Services  CM Consult  Post Acute Care Choice:    Choice offered to:     DME Arranged:    DME Agency:     HH Arranged:    HH Agency:     Status of Service:  In process, will continue to follow  If discussed at Long Length of Stay Meetings, dates discussed:    Additional Comments:  Quintella BatonJulie W. Fredick Schlosser, RN, BSN  Trauma/Neuro ICU Case Manager [phone]

## 2016-05-31 MED ORDER — PANTOPRAZOLE SODIUM 40 MG PO TBEC
40.0000 mg | DELAYED_RELEASE_TABLET | Freq: Every day | ORAL | Status: DC
  Administered 2016-06-01 – 2016-06-03 (×3): 40 mg via ORAL
  Filled 2016-05-31 (×3): qty 1

## 2016-05-31 NOTE — Progress Notes (Signed)
PHARMACIST - PHYSICIAN COMMUNICATION DR:   Wynetta Emeryram CONCERNING: Protonix IV to Oral Route Change Policy  RECOMMENDATION: This patient is receiving Protonix by the intravenous route.  Based on criteria approved by the Pharmacy and Therapeutics Committee, this drug is being converted to the equivalent oral dose form(s).  DESCRIPTION: These criteria include:  The patient is eating (either orally or via tube) and/or has been taking other orally administered medications for a least 24 hours  There is no active GI bleed or impaired GI absorption noted.   If you have questions about this conversion, please contact the Pharmacy Department  []   [phone]( [phone] )  [facility] [x]   [phone]( [phone] )  [facility]  []   [phone]  [facility] []   [phone]( [phone] )  [facility]    Thank you for allowing pharmacy to be a part of this patient's care.  Georgina PillionElizabeth Elda Dunkerson, PharmD, BCPS Clinical Pharmacist Pager: [phone] 05/31/2016 1:56 PM

## 2016-05-31 NOTE — Progress Notes (Signed)
No issues overnight. Pt reports no HA, good strength  EXAM:  BP 121/69   Pulse 77   Temp 97.5 F (36.4 C) (Oral)   Resp (!) 22   Ht 5\' 5"  (1.651 m)   Wt 50.2 kg (110 lb 10.7 oz)   SpO2 (!) 85%   BMI 18.42 kg/m   Awake, alert, oriented  Speech fluent, appropriate  CN grossly intact  5/5 BUE/BLE   IMPRESSION:  83 y.o. male s/p bilateral SDH evac, doing well  PLAN: - Transfer to floor - Mobilize with PT/OT

## 2016-05-31 NOTE — Progress Notes (Signed)
Transferred from ICU to 5c07 via wc. Up to bathroom w/min assist. vss.

## 2016-06-01 LAB — GLUCOSE, CAPILLARY
GLUCOSE-CAPILLARY: 93 mg/dL (ref 65–99)
Glucose-Capillary: 115 mg/dL — ABNORMAL HIGH (ref 65–99)

## 2016-06-01 NOTE — Progress Notes (Signed)
No issues overnight. Pt reports improvement in ambulation compared to preop. Minimal HA.  EXAM:  BP 128/60 (BP Location: Right Arm)   Pulse 82   Temp 98.7 F (37.1 C) (Oral)   Resp 18   Ht 5\' 5"  (1.651 m)   Wt 50.2 kg (110 lb 10.7 oz)   SpO2 99%   BMI 18.42 kg/m   Awake, alert, oriented  Speech fluent, appropriate  Good strength throughout  IMPRESSION:  83 y.o. male s/p evac bilateral SDH, doing well  PLAN: - Cont to mobilize with PT/OT - May look at d/c tomorrow, [facility]

## 2016-06-01 NOTE — Evaluation (Addendum)
Occupational Therapy Evaluation Patient Details Name: Henry Harvey MRN: [id number] DOB: 06-25-1926 Today's Date: 06/01/2016    History of Present Illness Patient is an 83 year old gentleman with a history of confusion recently placed on medicine for dementia whose had a fall today underwent CT scan that showed bilateral subacute subdural hematomas. S/p Bilateral bur hole craniectomy for evacuation of bilateral subacute subdural hematomas 1/31   Clinical Impression   This 83 yo male admitted and underwent above presents to acute OT with deficits below (see OT problem list) thus affecting his PLOF of being totally independent with basic ADLs and some IADLs. He will benefit from acute OT with follow up HHOT as long as he initally has 24/7 support at home (until family feels safe to leave him alone), otherwise recommendation is SNF.     Follow Up Recommendations  Home health OT;Supervision/Assistance - 24 hour;Other (comment) (if does not have 24/7 then would recommend SNF)    Equipment Recommendations  None recommended by OT       Precautions / Restrictions Precautions Precautions: Fall Restrictions Weight Bearing Restrictions: No      Mobility Bed Mobility Overal bed mobility: Independent             General bed mobility comments: HOB flat and no rail coming out on left side (as he does at home per his report)  Transfers Overall transfer level: Needs assistance Equipment used: Rolling walker (2 wheeled) Transfers: Sit to/from Stand Sit to Stand: Min guard         General transfer comment: min A for ambulation without RW, Minguard A with RW with cues to stay inside RW    Balance Overall balance assessment: Needs assistance;History of Falls Sitting-balance support: No upper extremity supported;Feet supported Sitting balance-Leahy Scale: Good Sitting balance - Comments: Able to doff and don socks while sitting EOB without LOB   Standing balance support: No upper  extremity supported;During functional activity Standing balance-Leahy Scale: Fair                              ADL Overall ADL's : Needs assistance/impaired Eating/Feeding: Independent;Sitting   Grooming: Min guard;Standing   Upper Body Bathing: Set up;Sitting Upper Body Bathing Details (indicate cue type and reason): sponge bath Lower Body Bathing: Min guard;Sit to/from stand Lower Body Bathing Details (indicate cue type and reason): sponge bath Upper Body Dressing : Set up;Sitting   Lower Body Dressing: Min guard;Sit to/from stand   Toilet Transfer: Min guard;Ambulation;RW;Regular Toilet;Grab bars   Toileting- Clothing Manipulation and Hygiene: Min guard;Sit to/from stand          Made pt aware that I recommend he sit on a seat to shower and get a non skid surface to stand on in tub for safety as as he gets in and out of tub. Pt had already stated to me earlier that he was planning on getting grab bar(s) for his tub.     Vision Vision Assessment?: Yes Eye Alignment: Within Functional Limits Ocular Range of Motion: Within Functional Limits Alignment/Gaze Preference: Within Defined Limits Tracking/Visual Pursuits: Able to track stimulus in all quads without difficulty Convergence: Within functional limits Visual Fields: No apparent deficits          Pertinent Vitals/Pain Pain Assessment: No/denies pain     Hand Dominance Right   Extremity/Trunk Assessment Upper Extremity Assessment Upper Extremity Assessment: Overall WFL for tasks assessed   Lower Extremity Assessment Lower Extremity Assessment:  Defer to PT evaluation       Communication Communication Communication: No difficulties   Cognition Arousal/Alertness: Awake/alert Behavior During Therapy: WFL for tasks assessed/performed Overall Cognitive Status: Within Functional Limits for tasks assessed                 General Comments: Did show decreased safety with where to put hands for  sit<>stand transfers with RW to/from recliner              Home Living Family/patient expects to be discharged to:: Private residence Living Arrangements: Alone Available Help at Discharge: Family;Available 24 hours/day (per pt, but they do not currently live with him) Type of Home: House Home Access: Stairs to enter Entergy CorporationEntrance Stairs-Number of Steps: 3 Entrance Stairs-Rails: None Home Layout: One level     Bathroom Shower/Tub: Tub/shower unit;Curtain Shower/tub characteristics: Engineer, building servicesCurtain Bathroom Toilet: Standard     Home Equipment: Environmental consultantWalker - 2 wheels;Shower seat          Prior Functioning/Environment Level of Independence: Independent        Comments: including driving. Dtr did A with some of his IADLs        OT Problem List: Impaired balance (sitting and/or standing)   OT Treatment/Interventions: Self-care/ADL training;Patient/family education;Balance training;DME and/or AE instruction    OT Goals(Current goals can be found in the care plan section) Acute Rehab OT Goals Patient Stated Goal: to go home OT Goal Formulation: With patient Time For Goal Achievement: 06/08/16 Potential to Achieve Goals: Good  OT Frequency: Min 2X/week   Barriers to D/C: Decreased caregiver support  Pt normally lives alone, but says someone can stay with him 24/7 for a several days          End of Session Equipment Utilized During Treatment: Gait belt;Rolling walker Nurse Communication: Mobility status (NT)  Activity Tolerance: Patient tolerated treatment well Patient left: in chair;with call bell/phone within reach;with chair alarm set   Time: [id number] OT Time Calculation (min): 34 min Charges:  OT General Charges $OT Visit: 1 Procedure OT Evaluation $OT Eval Moderate Complexity: 1 Procedure OT Treatments $Self Care/Home Management : 8-22 mins  Evette GeorgesLeonard, Risha Barretta Eva [phone] 06/01/2016, 9:05 AM

## 2016-06-01 NOTE — Evaluation (Signed)
Physical Therapy Evaluation Patient Details Name: Henry Harvey MRN: [id number] DOB: Apr 13, 1927 Today's Date: 06/01/2016   History of Present Illness  Patient is an 83 year old gentleman with a history of confusion recently placed on medicine for dementia whose had a fall today underwent CT scan that showed bilateral subacute subdural hematomas. S/p Bilateral bur hole craniectomy for evacuation of bilateral subacute subdural hematomas 1/31  Clinical Impression  Pt admitted with above diagnosis. Pt currently with functional limitations due to the deficits listed below (see PT Problem List). Pt scored 48/56 on Berg balance indicating moderate fall risk. Pt much safer with RW for ambulation than with no AD, recommended that he ambulate 2 more times today with RW and NT.  Pt will benefit from skilled PT to increase their independence and safety with mobility to allow discharge to the venue listed below.       Follow Up Recommendations Home health PT;Supervision/Assistance - 24 hour    Equipment Recommendations  None recommended by PT    Recommendations for Other Services       Precautions / Restrictions Precautions Precautions: Fall Restrictions Weight Bearing Restrictions: No      Mobility  Bed Mobility Overal bed mobility: Independent             General bed mobility comments: HOB flat and no rail coming out on left side (as he does at home per his report)  Transfers Overall transfer level: Needs assistance Equipment used: Rolling walker (2 wheeled) Transfers: Sit to/from Stand Sit to Stand: Min guard         General transfer comment: vc's for hand placement when using RW. Performed transfers with and without RW  Ambulation/Gait Ambulation/Gait assistance: Min assist;Supervision Ambulation Distance (Feet): 300 Feet Assistive device: Rolling walker (2 wheeled);None Gait Pattern/deviations: Step-through pattern;Decreased stride length;Staggering right;Staggering  left;Drifts right/left Gait velocity: decreased Gait velocity interpretation: Below normal speed for age/gender General Gait Details: pt first ambulated without AD and staggered right and left as well as beginning to grab rail after 50', min A needed to steady. Pt also with very slow gait without AD. With RW, pt able to ambulate much faster and safe with only supervision  Stairs Stairs: Yes Stairs assistance: Min guard Stair Management: One rail Right;Alternating pattern;Forwards Number of Stairs: 6 General stair comments: no difficulty on stairs  Wheelchair Mobility    Modified Rankin (Stroke Patients Only)       Balance Overall balance assessment: Needs assistance;History of Falls Sitting-balance support: No upper extremity supported;Feet supported Sitting balance-Leahy Scale: Good Sitting balance - Comments: Able to doff and don socks while sitting EOB without LOB   Standing balance support: No upper extremity supported;During functional activity Standing balance-Leahy Scale: Fair Standing balance comment: recahes for stable surfaces for support                 Standardized Balance Assessment Standardized Balance Assessment : Berg Balance Test Berg Balance Test Sit to Stand: Able to stand without using hands and stabilize independently Standing Unsupported: Able to stand safely 2 minutes Sitting with Back Unsupported but Feet Supported on Floor or Stool: Able to sit safely and securely 2 minutes Stand to Sit: Sits safely with minimal use of hands Transfers: Able to transfer safely, minor use of hands Standing Unsupported with Eyes Closed: Able to stand 10 seconds safely Standing Ubsupported with Feet Together: Able to place feet together independently and stand 1 minute safely From Standing, Reach Forward with Outstretched Arm: Can reach forward >12 cm safely (  5") From Standing Position, Pick up Object from Floor: Able to pick up shoe safely and easily From Standing  Position, Turn to Look Behind Over each Shoulder: Looks behind from both sides and weight shifts well Turn 360 Degrees: Able to turn 360 degrees safely in 4 seconds or less Standing Unsupported, Alternately Place Feet on Step/Stool: Able to complete 4 steps without aid or supervision Standing Unsupported, One Foot in Front: Able to take small step independently and hold 30 seconds Standing on One Leg: Tries to lift leg/unable to hold 3 seconds but remains standing independently Total Score: 48         Pertinent Vitals/Pain Pain Assessment: No/denies pain    Home Living Family/patient expects to be discharged to:: Private residence Living Arrangements: Alone Available Help at Discharge: Family;Available 24 hours/day Type of Home: House Home Access: Stairs to enter Entrance Stairs-Rails: None Entrance Stairs-Number of Steps: 3 Home Layout: One level Home Equipment: Walker - 2 wheels;Shower seat      Prior Function Level of Independence: Independent         Comments: including driving. Dtr did A with some of his IADLs     Hand Dominance   Dominant Hand: Right    Extremity/Trunk Assessment   Upper Extremity Assessment Upper Extremity Assessment: Defer to OT evaluation    Lower Extremity Assessment Lower Extremity Assessment: Overall WFL for tasks assessed    Cervical / Trunk Assessment Cervical / Trunk Assessment: Normal  Communication   Communication: No difficulties  Cognition Arousal/Alertness: Awake/alert Behavior During Therapy: WFL for tasks assessed/performed Overall Cognitive Status: Within Functional Limits for tasks assessed                 General Comments: Did show decreased safety with where to put hands for sit<>stand transfers with RW to/from bed    General Comments      Exercises     Assessment/Plan    PT Assessment Patient needs continued PT services  PT Problem List Decreased balance;Decreased mobility;Decreased knowledge of use  of DME;Decreased knowledge of precautions          PT Treatment Interventions DME instruction;Gait training;Stair training;Functional mobility training;Therapeutic activities;Therapeutic exercise;Balance training;Patient/family education    PT Goals (Current goals can be found in the Care Plan section)  Acute Rehab PT Goals Patient Stated Goal: to go home PT Goal Formulation: With patient Time For Goal Achievement: 06/15/16 Potential to Achieve Goals: Good    Frequency Min 4X/week   Barriers to discharge   pt says he has 24 hr assist but family did not live with him prior    Co-evaluation               End of Session Equipment Utilized During Treatment: Gait belt Activity Tolerance: Patient tolerated treatment well Patient left: in bed;with call bell/phone within reach;with bed alarm set Nurse Communication: Mobility status         Time: [id number] PT Time Calculation (min) (ACUTE ONLY): 40 min   Charges:   PT Evaluation $PT Eval Moderate Complexity: 1 Procedure PT Treatments $Gait Training: 8-22 mins $Therapeutic Activity: 8-22 mins   PT G Codes:      Lyanne CoVictoria Razi Hickle, PT  Acute Rehab Services  [phone]   [place]ia L Gray Maugeri 06/01/2016, 12:30 PM

## 2016-06-02 MED ORDER — LEVETIRACETAM 500 MG PO TABS
500.0000 mg | ORAL_TABLET | Freq: Two times a day (BID) | ORAL | Status: DC
  Administered 2016-06-02 – 2016-06-03 (×2): 500 mg via ORAL
  Filled 2016-06-02 (×2): qty 1

## 2016-06-02 NOTE — Care Management Important Message (Signed)
Important Message  Patient Details  Name: Henry Harvey MRN: [id number] Date of Birth: 06/24/1926   Medicare Important Message Given:  Yes    Eiman Maret Stefan ChurchBratton 06/02/2016, 4:35 PM

## 2016-06-02 NOTE — Progress Notes (Signed)
Physical Therapy Treatment Patient Details Name: Henry Harvey MRN: [id number] DOB: 11/18/1926 Today's Date: 06/02/2016    History of Present Illness Patient is an 83 year old gentleman with a history of confusion recently placed on medicine for dementia whose had a fall today underwent CT scan that showed bilateral subacute subdural hematomas. S/p Bilateral bur hole craniectomy for evacuation of bilateral subacute subdural hematomas 1/31    PT Comments    Patient progressing with balance and safety with walker.  Feel continued HHPT and 24 hour assist needed at d/c.  PT to follow during acute stay.   Follow Up Recommendations  Home health PT;Supervision/Assistance - 24 hour     Equipment Recommendations  None recommended by PT    Recommendations for Other Services       Precautions / Restrictions Precautions Precautions: Fall    Mobility  Bed Mobility Overal bed mobility: Independent                Transfers Overall transfer level: Needs assistance Equipment used: Rolling walker (2 wheeled) Transfers: Sit to/from Stand Sit to Stand: Min guard         General transfer comment: assist for safety, stood without walker and back of legs braced on bed for balance  Ambulation/Gait Ambulation/Gait assistance: Min assist;Supervision Ambulation Distance (Feet): 300 Feet Assistive device: Rolling walker (2 wheeled) Gait Pattern/deviations: Step-through pattern;Decreased stride length     General Gait Details: in room parked RW and walked to chair no LOB, some increased BOS; cues for hand placement on walker and for proximity, seems safe with RW, reviewed safety with using for mobility for fall prevention   Stairs     Stair Management: Alternating pattern;Forwards;One rail Right Number of Stairs: 4 General stair comments: discussed walker management on stairs  Wheelchair Mobility    Modified Rankin (Stroke Patients Only)       Balance Overall balance  assessment: Needs assistance;History of Falls Sitting-balance support: No upper extremity supported;Feet supported Sitting balance-Leahy Scale: Good       Standing balance-Leahy Scale: Good Standing balance comment: can move between walker and chair in room no AD about 5'               High Level Balance Comments: stand step taps to 6" step no UE support min A; side steps near rail no UE support minguard & cues for technique; forward marching min A, heel walking min A and cues, and with UE support ant/post rocking for limits of stability.    Cognition Arousal/Alertness: Awake/alert Behavior During Therapy: WFL for tasks assessed/performed Overall Cognitive Status: Within Functional Limits for tasks assessed                      Exercises      General Comments        Pertinent Vitals/Pain Pain Assessment: No/denies pain    Home Living                      Prior Function            PT Goals (current goals can now be found in the care plan section) Progress towards PT goals: Progressing toward goals    Frequency    Min 4X/week      PT Plan      Co-evaluation             End of Session Equipment Utilized During Treatment: Gait belt Activity Tolerance: Patient tolerated treatment well Patient  left: in chair;with chair alarm set     Time: [id number] PT Time Calculation (min) (ACUTE ONLY): 25 min  Charges:  $Gait Training: 8-22 mins $Neuromuscular Re-education: 8-22 mins                    G Codes:      Elray Mcgregor June 13, 2016, 10:38 AM  Sheran Lawless, PT [phone] June 13, 2016

## 2016-06-02 NOTE — Progress Notes (Signed)
Occupational Therapy Treatment Patient Details Name: Henry Harvey MRN: [id number] DOB: June 05, 1926 Today's Date: 06/02/2016    History of present illness Patient is an 83 year old gentleman with a history of confusion recently placed on medicine for dementia whose had a fall today underwent CT scan that showed bilateral subacute subdural hematomas. S/p Bilateral bur hole craniectomy for evacuation of bilateral subacute subdural hematomas 1/31   OT comments  Pt progressing well toward OT goals. He gathered multiple items for grooming tasks around his room with min verbal cues this session and supervision for safety during functional mobility. Pt educated on safe tub transfers with tub bench and he was able to complete with supervision. He reports understanding of education but would benefit from further reinforcement as pt does demonstrate slightly decreased safety awareness.   Follow Up Recommendations  Home health OT;Supervision/Assistance - 24 hour    Equipment Recommendations  None recommended by OT    Recommendations for Other Services      Precautions / Restrictions Precautions Precautions: Fall Restrictions Weight Bearing Restrictions: No       Mobility Bed Mobility Overal bed mobility: Independent                Transfers Overall transfer level: Needs assistance Equipment used: Rolling walker (2 wheeled) Transfers: Sit to/from Stand Sit to Stand: Supervision         General transfer comment: Supervision for safety with RW.    Balance Overall balance assessment: Needs assistance;History of Falls Sitting-balance support: No upper extremity supported;Feet supported Sitting balance-Leahy Scale: Good     Standing balance support: No upper extremity supported;During functional activity Standing balance-Leahy Scale: Good                     ADL Overall ADL's : Needs assistance/impaired     Grooming: Supervision/safety;Standing                    Toilet Transfer: Supervision/safety;RW;Regular Geophysical data processor: Supervision/safety;Tub transfer;Tub bench;Rolling walker Tub/Shower Transfer Details (indicate cue type and reason): Educated pt on safe tub transfers with tub bench. He demonstrates ability to complete with supervision for safety. Functional mobility during ADLs: Supervision/safety;Rolling walker General ADL Comments: Pt requires VC's for safe use of RW. He tends to put this aside in room and then requires min guard assist for mobility. Educated on improved safety when using RW.      Vision                     Perception     Praxis      Cognition   Behavior During Therapy: [name] for tasks assessed/performed Overall Cognitive Status: Within Functional Limits for tasks assessed                  General Comments: Slightly decreased safety awareness with RW use.    Extremity/Trunk Assessment               Exercises     Shoulder Instructions       General Comments      Pertinent Vitals/ Pain       Pain Assessment: No/denies pain  Home Living                                          Prior Functioning/Environment  Frequency  Min 2X/week        Progress Toward Goals  OT Goals(current goals can now be found in the care plan section)  Progress towards OT goals: Progressing toward goals  Acute Rehab OT Goals Patient Stated Goal: to go home OT Goal Formulation: With patient Time For Goal Achievement: 06/08/16 Potential to Achieve Goals: Good ADL Goals Pt Will Perform Grooming: with supervision (pt ambulating around room from RW level gathering items) Pt Will Transfer to Toilet: with supervision;ambulating;regular height toilet;grab bars Pt Will Perform Toileting - Clothing Manipulation and hygiene: with supervision;sit to/from stand Pt Will Perform Tub/Shower Transfer: Tub transfer;with min guard assist;ambulating;rolling  walker;shower seat  Plan Discharge plan remains appropriate    Co-evaluation                 End of Session Equipment Utilized During Treatment: Gait belt;Rolling walker   Activity Tolerance Patient tolerated treatment well   Patient Left in chair;with call bell/phone within reach;with chair alarm set   Nurse Communication          Time: [id number] OT Time Calculation (min): 22 min  Charges: OT General Charges $OT Visit: 1 Procedure OT Treatments $Self Care/Home Management : 8-22 mins  Doristine SectionCharity A Trayveon Harvey, OTR/L [phone] 06/02/2016, 4:39 PM

## 2016-06-02 NOTE — Discharge Summary (Signed)
Physician Discharge Summary  Patient ID: Henry Harvey MRN: [id number] DOB/AGE: 81/11/1926 81 y.o.  Admit date: 05/28/2016 Discharge date: 06/02/2016  Admission Diagnoses:Subdural hematomas  Discharge Diagnoses: Same Active Problems:   SDH (subdural hematoma) (HCC)   Subdural hematoma (HCC)   Hypertension   Hyperlipidemia   Syncope   Dementia without behavioral disturbance   Anemia due to acute blood loss   Hyperglycemia   Discharged Condition: good  Hospital Course: Patient doing very well was made hospital through the emergency room noted to have bilateral subacute subdural hematomas. Patient was taken to the operating room underwent bilateral bur holes for placement of subdural hematomas with drains postoperatively patient went to the ICU 1 and then on the floor convalesced well significant improvement mental status and functional abilities was stable for discharge home with home health  Consults: Critical-care pulmonology Significant Diagnostic Studies: Treatments: Bilateral bur holes Discharge Exam: Blood pressure (!) 129/52, pulse 89, temperature 98 F (36.7 C), temperature source Oral, resp. rate 18, height 5\' 5"  (1.651 m), weight 50.2 kg (110 lb 10.7 oz), SpO2 100 %. Awake alert oriented strength out of 5 wound clean dry and intact  Disposition: Home  Discharge Instructions    Face-to-face encounter (required for Medicare/Medicaid patients)    Complete by:  As directed    I Shadara Lopez P certify that this patient is under my care and that I, or a nurse practitioner or physician's assistant working with me, had a face-to-face encounter that meets the physician face-to-face encounter requirements with this patient on 06/02/2016. The encounter with the patient was in whole, or in part for the following medical condition(s) which is the primary reason for home health care (List medical condition): Subdural hematomas   The encounter with the patient was in whole, or in part, for  the following medical condition, which is the primary reason for home health care:  Subdural hematomas   I certify that, based on my findings, the following services are medically necessary home health services:  Physical therapy   Reason for Medically Necessary Home Health Services:  Therapy- Instruction on Safe use of Assistive Devices for ADLs   My clinical findings support the need for the above services:  Unable to leave home safely without assistance and/or assistive device   Further, I certify that my clinical findings support that this patient is homebound due to:  Unable to leave home safely without assistance   Home Health    Complete by:  As directed    To provide the following care/treatments:   PT OT       Allergies as of 06/02/2016      Reactions   Penicillins Rash, Other (See Comments)   Has patient had a PCN reaction causing immediate rash, facial/tongue/throat swelling, SOB or lightheadedness with hypotension: Yes Has patient had a PCN reaction causing severe rash involving mucus membranes or skin necrosis: No Has patient had a PCN reaction that required hospitalization: No Has patient had a PCN reaction occurring within the last 10 years: No If all of the above answers are "NO", then may proceed with Cephalosporin use.      Medication List    TAKE these medications   albuterol 108 (90 Base) MCG/ACT inhaler Commonly known as:  PROVENTIL HFA;VENTOLIN HFA Inhale 2 puffs into the lungs every 4 (four) hours as needed for wheezing or shortness of breath.   amLODipine 2.5 MG tablet Commonly known as:  NORVASC Take 2.5 mg by mouth daily.   aspirin 81  MG EC tablet Take 81 mg by mouth daily.   atorvastatin 10 MG tablet Commonly known as:  LIPITOR Take 10 mg by mouth daily.   ciprofloxacin 500 MG tablet Commonly known as:  CIPRO Take 1 tablet (500 mg total) by mouth every 12 (twelve) hours.   dorzolamide-timolol 22.3-6.8 MG/ML ophthalmic solution Commonly known as:   COSOPT Place 1 drop into the right eye 2 (two) times daily.   ENSURE Take 237 mLs by mouth 2 (two) times daily between meals.   latanoprost 0.005 % ophthalmic solution Commonly known as:  XALATAN Place 1 drop into both eyes at bedtime.   metroNIDAZOLE 500 MG tablet Commonly known as:  FLAGYL Take 1 tablet (500 mg total) by mouth 2 (two) times daily.   NAMZARIC 7 & 14 & 21 &28 -10 MG C4pk Generic drug:  Memantine HCl-Donepezil HCl Take 7-28 mg by mouth See admin instructions. 7 mg/10 mg once a day during week 1; 14 mg/10 mg once a day during week 2, 21 mg/10 mg once a day during week 3; 28 mg/10 mg once a day during week 4   nitroGLYCERIN 0.4 MG SL tablet Commonly known as:  NITROSTAT Place 0.4 mg under the tongue every 5 (five) minutes as needed for chest pain.   nitroGLYCERIN 0.2 mg/hr patch Commonly known as:  NITRODUR - Dosed in mg/24 hr Place 0.2 mg onto the skin daily.   ondansetron 4 MG disintegrating tablet Commonly known as:  ZOFRAN ODT Take 1 tablet (4 mg total) by mouth every 8 (eight) hours as needed for nausea or vomiting.      Follow-up Information    Myrla Malanowski P, MD Follow up.   Specialty:  Neurosurgery Contact information: [address] [place] [address] [phone]        Mariam Dollar, MD .   Specialty:  Neurosurgery Contact information: [address] [place] [address] [phone]           Signed: Mariam Dollar 06/02/2016, 5:21 PM

## 2016-06-03 NOTE — Care Management Note (Signed)
Case Management Note  Patient Details  Name: Henry Harvey MRN: [id number] Date of Birth: 04/06/1927  Subjective/Objective:                    Action/Plan: Pt discharging home with orders for [facility] services. CM met with the patient and his caregiver "[name]" and provided a list of [place] agencies. [name] called pts daughter [name] and went over the list. [name] selected [facility]. CM spoke to [place] with [facility] and she accepted the referral.  Per [name] she stays with the patient during the day. She states that [name] is going to stay with him at night after d/c.  Pt has transportation home.   Expected Discharge Date:  06/02/16               Expected Discharge Plan:  [place]  In-House Referral:     Discharge planning Services  CM Consult  Post Acute Care Choice:  Home Health Choice offered to:  Adult Children  DME Arranged:    DME Agency:     HH Arranged:  PT, OT [place] Agency:  [place] (now [facility])  Status of Service:  Completed, signed off  If discussed at [place] of Stay Meetings, dates discussed:    Additional Comments:  Pollie Friar, RN 06/03/2016, 11:04 AM

## 2016-06-03 NOTE — Discharge Instructions (Signed)
Subdural Hematoma A subdural hematoma is a collection of blood between the brain and its tough outermost membrane covering (the dura). Blood clots that form in this area push down on the brain and cause irritation. A subdural hematoma may cause parts of the brain to stop working and eventually cause death.  CAUSES A subdural hematoma is caused by bleeding from a ruptured blood vessel (hemorrhage). The bleeding results from trauma to the head, such as from a fall or motor vehicle accident. There are two types of subdural hemorrhages:  Acute. This type develops shortly after a serious blow to the head and causes blood to collect very quickly. If not diagnosed and treated promptly, severe brain injury or death can occur.  Chronic. This is when bleeding develops more slowly, over weeks or months. RISK FACTORS People at risk for subdural hematoma include older persons, infants, and alcoholics. SYMPTOMS An acute subdural hemorrhage develops over minutes to hours. Symptoms can include:  Temporary loss of consciousness.  Weakness of arms or legs on one side of the body.  Changes in vision or speech.  A severe headache.  Seizures.  Nausea and vomiting.  Increased sleepiness. A chronic subdural hemorrhage develops over weeks to months. Symptoms may develop slowly and produce less noticeable problems or changes. Symptoms include:  A mild headache.  A change in personality.  Loss of balance or difficulty walking.  Weakness, numbness, or tingling in the arms or legs.  Nausea or vomiting.  Memory loss.  Double vision.  Increased sleepiness. DIAGNOSIS Your health care provider will perform a thorough physical and neurological exam. A CT scan or MRI may also be done. If there is blood on the scan, its color will help your health care provider determine how long the hemorrhage has been there. TREATMENT If the cause is an acute subdural hemorrhage, immediate treatment is needed. In many  cases an emergency surgery is performed to drain accumulated blood or to remove the blood clot. Sometimes steroid or diuretic medicines or controlled breathing through a ventilator is needed to decrease pressure in the brain. This is especially true if there is any swelling of the brain. If the cause is a chronic subdural hemorrhage, treatment depends on a variety of factors. Sometimes no treatment is needed. If the subdural hematoma is small and causes minimal or no symptoms, you may be treated with bed rest, medicines, and observation. If the hemorrhage is large or if you have neurological symptoms, an emergency surgery is usually needed to remove the blood clot. People who develop a subdural hemorrhage are at risk of developing seizures, even after the subdural hematoma has been treated. You may be prescribed an anti-seizure (anticonvulsant) medicine for a year or longer. HOME CARE INSTRUCTIONS  Only take medicines as directed by your health care provider.  Rest if directed by your health care provider.  Keep all follow-up appointments with your health care provider.  If you play a contact sport such as football, hockey or soccer and you experienced a significant head injury, allow enough time for healing (up to 15 days) before you start playing again. A repeated injury that occurs during this fragile repair period is likely to result in hemorrhage. This is called the second impact syndrome. SEEK IMMEDIATE MEDICAL CARE IF:  You fall or experience minor trauma to your head and you are taking blood thinners. If you are on any blood thinners even a very small injury can cause a subdural hematoma. You should not hesitate to seek  medical attention regardless of how minor you think your symptoms are.  You experience a head injury and have:  Drowsiness or a decrease in alertness.  Confusion or forgetfulness.  Slurred speech.  Irrational or aggressive behavior.  Numbness or paralysis in any part  of the body.  A feeling of being sick to your stomach (nauseous) or you throw up (vomit).  Difficulty walking or poor coordination.  Double vision.  Seizures.  A bleeding disorder.  A history of heavy alcohol use.  Clear fluid draining from your nose or ears.  Personality changes.  Difficulty thinking.  Worsening symptoms. MAKE SURE YOU:  Understand these instructions.  Will watch your condition.  Will get help right away if you are not doing well or get worse. FOR MORE INFORMATION National Institute of Neurological Disorders and Stroke: [employer].co.[employer] American Association of Neurological Surgeons: www.neurosurgerytoday.org American Academy of Neurology (AAN): ComparePet.cz Brain Injury Association of America: www.biausa.org This information is not intended to replace advice given to you by your health care provider. Make sure you discuss any questions you have with your health care provider. Document Released: 03/01/2004 Document Revised: 02/02/2013 Document Reviewed: 10/15/2012 Elsevier Interactive Patient Education  2017 Elsevier Inc. Subdural Hematoma Evacuation, Care After Refer to this sheet in the next few weeks. These instructions provide you with information on caring for yourself after your procedure. Your health care provider may also give you more specific instructions. Your treatment has been planned according to current medical practices, but problems sometimes occur. Call your health care provider if you have any problems or questions after your procedure.  WHAT TO EXPECT AFTER THE PROCEDURE  After your procedure, it is typical to have the following:  Pain in your scalp, especially in the incision area. You will be given pain medicines to control this.  Constipation. You may be given a stool softener to help prevent this. HOME CARE INSTRUCTIONS  Take showers as directed by your health care provider. Do not bathe, swim, or use a hot tub until directed by  your health care provider.  Take all medicines as directed by your health care provider.  If your health care provider prescribes antibiotic medicines, take them as directed. Finish them even if you start to feel better.  Do the following to help prevent constipation:  Eat plenty of fruits and vegetables.  Drink enough fluids to keep your urine clear or pale yellow.  Schedule and attend follow-up visits as directed by your health care provider. It is important to keep all your appointments. SEEK MEDICAL CARE IF:   You have a fever.  You have pain that is not relieved with medicine.   You do not feel like eating.   You have constipation that is not relieved with prescribed stool softeners.   You have drainage, redness, swelling, or pain at your incision site.  SEEK IMMEDIATE MEDICAL CARE IF:  Your incision site opens up.   You have severe headaches.  You have nausea and throwing up (vomiting).   You have vision problems.  You have chest pain.  You have trouble breathing.  You have pain or swelling of the calves. This information is not intended to replace advice given to you by your health care provider. Make sure you discuss any questions you have with your health care provider. Document Released: 02/02/2013 Document Revised: 04/19/2013 Document Reviewed: 02/02/2013 Elsevier Interactive Patient Education  2017 Elsevier Inc. No lifting no bending no twisting no driving no riding a car unless he is  calm back and forth to see me. Keep incisions clean dry and intact. May remove the outer dressings in 2-3 days

## 2016-06-03 NOTE — Progress Notes (Signed)
Patient discharged to home with family. Assessment stable. All questions answered. Taken down by wheelchair by volunteers.

## 2016-06-11 ENCOUNTER — Other Ambulatory Visit: Payer: Self-pay | Admitting: Neurosurgery

## 2016-06-11 DIAGNOSIS — I6200 Nontraumatic subdural hemorrhage, unspecified: Secondary | ICD-10-CM

## 2016-06-17 ENCOUNTER — Inpatient Hospital Stay
Admission: RE | Admit: 2016-06-17 | Discharge: 2016-06-17 | Disposition: A | Discharge status: Home / Self Care | Payer: Medicare HMO | Source: Ambulatory Visit | Attending: Neurosurgery | Admitting: Neurosurgery

## 2016-06-23 ENCOUNTER — Ambulatory Visit
Admission: RE | Admit: 2016-06-23 | Discharge: 2016-06-23 | Disposition: A | Discharge status: Home / Self Care | Payer: Medicare HMO | Source: Ambulatory Visit | Attending: Neurosurgery | Admitting: Neurosurgery

## 2016-06-23 DIAGNOSIS — I6200 Nontraumatic subdural hemorrhage, unspecified: Secondary | ICD-10-CM

## 2016-09-29 NOTE — Addendum Note (Signed)
Addendum  created 09/29/16 1036 by Val EagleMoser, Joshau Code, MD   Sign clinical note

## 2018-01-12 IMAGING — CT CT HEAD W/O CM
4 series · 15 of 47 positions shown, 17 images · non-contrast
Comparison: 05/28/2016

CLINICAL DATA: Followup subdural evacuation is

EXAM:
CT HEAD WITHOUT CONTRAST
TECHNIQUE: Contiguous axial images were obtained from the base of the skull
through the vertex without intravenous contrast.

[Series 2: head without · axial · non-contrast · 0.47mm/px · z∈[-80,+26]mm · 7 of 29 slices shown, 9 images]
[im 4/29  brain]
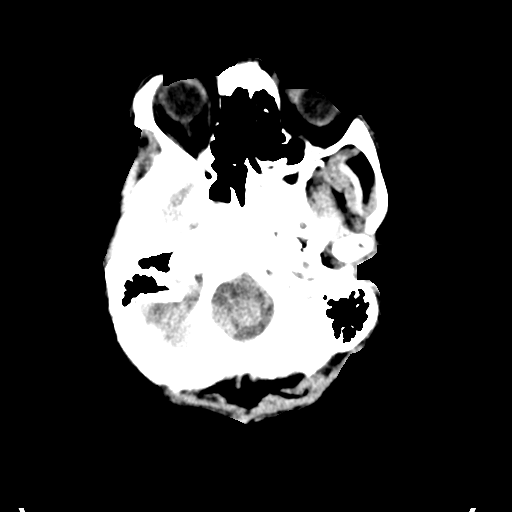
[im 4/29  bone]
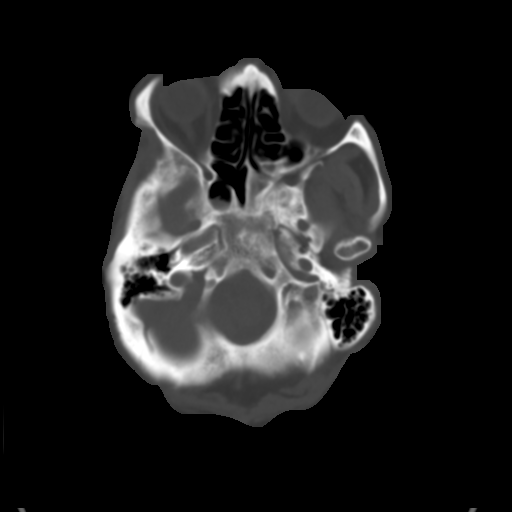
[im 8/29  brain]
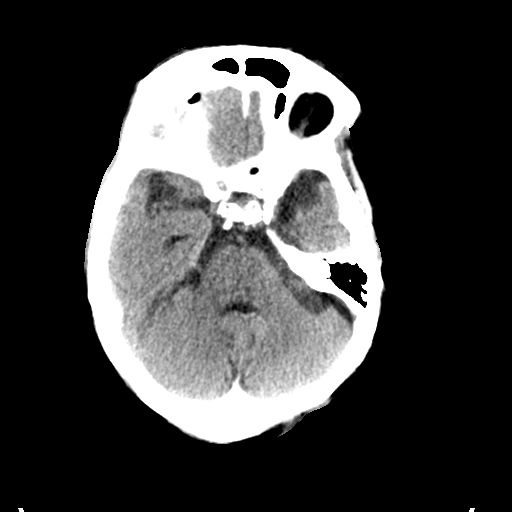
[im 11/29  brain]
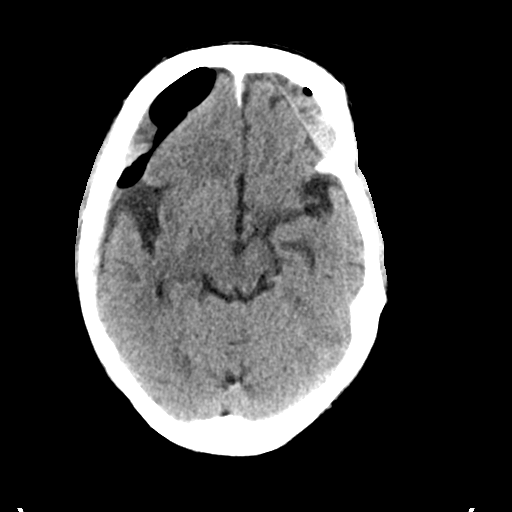
[im 15/29  brain]
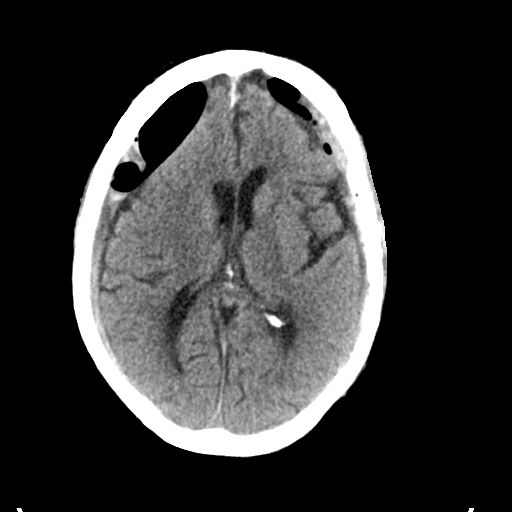
[im 18/29  brain]
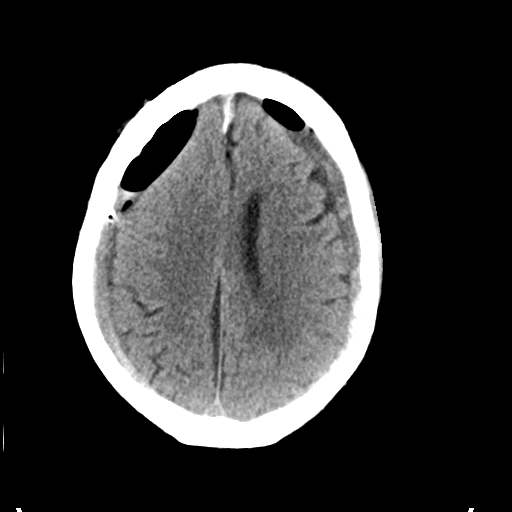
[im 18/29  bone]
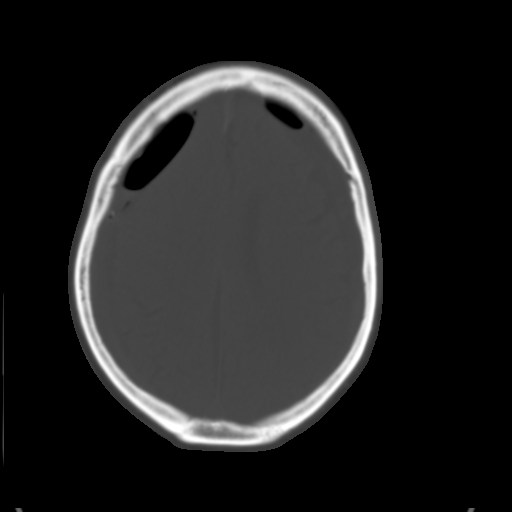
[im 22/29  brain]
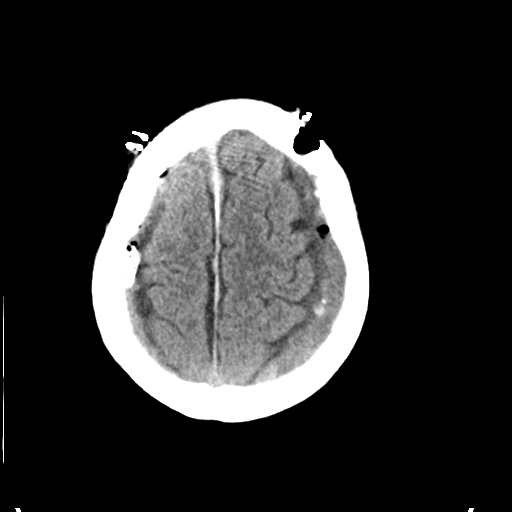
[im 25/29  brain]
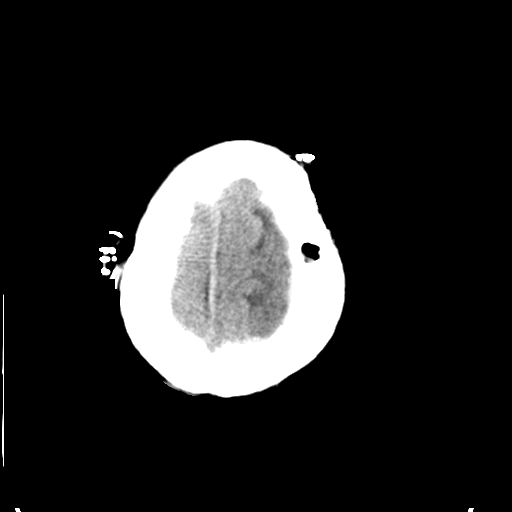

[Series 3: head bone · axial · 0.47mm/px · z∈[-80,-66]mm · 2 of 71 slices shown]
[im 8/71  bone]
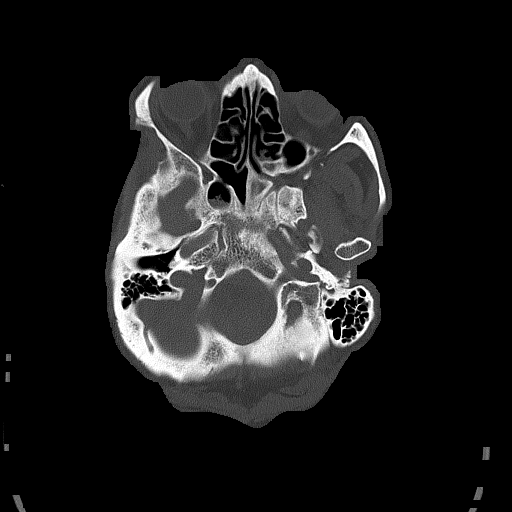
[im 15/71  bone]
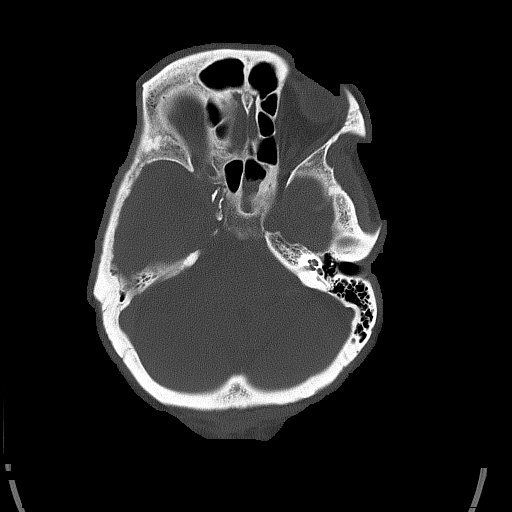

[Series 4: head without cor · coronal · non-contrast · 0.27mm/px · 3 of 68 slices shown]
[im 23/68  brain]
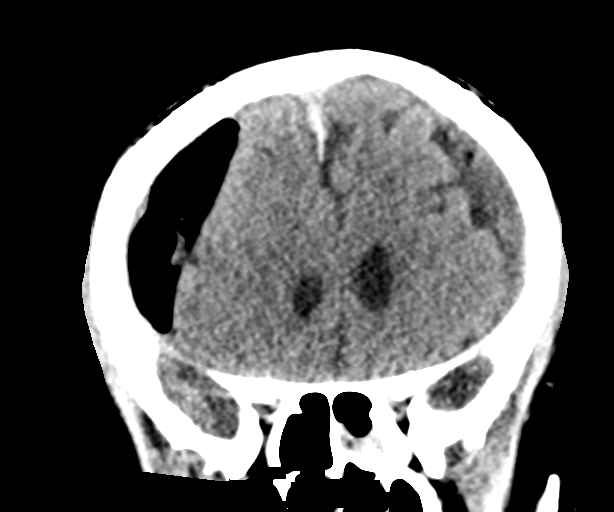
[im 30/68  brain]
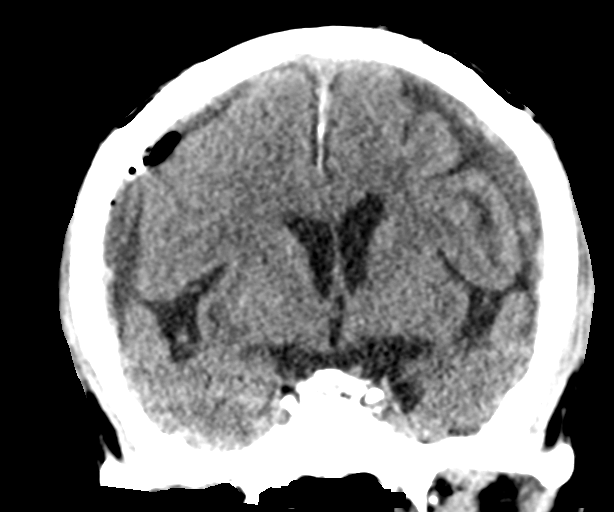
[im 38/68  brain]
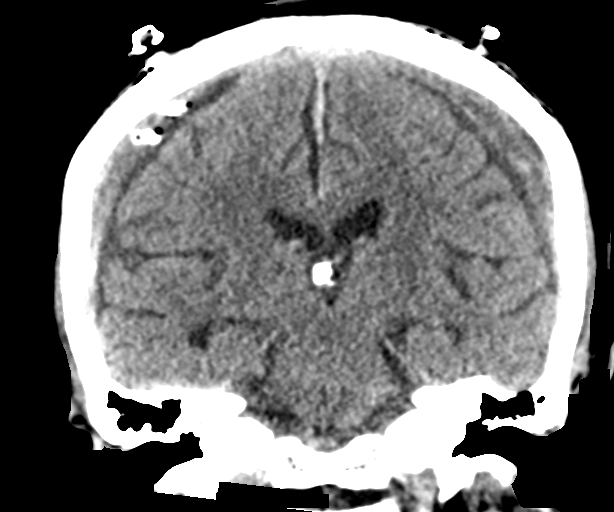

[Series 5: head without sag · sagittal · non-contrast · 0.27mm/px · 3 of 67 slices shown]
[im 23/67  brain]
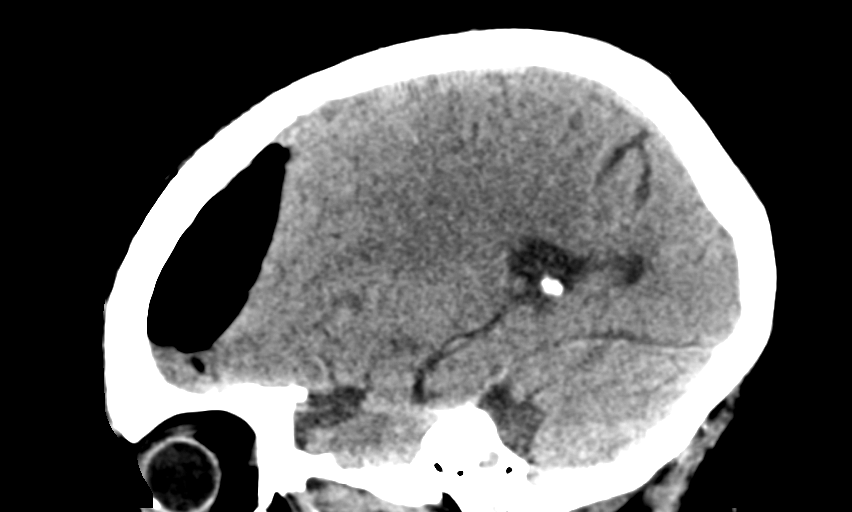
[im 34/67  brain]
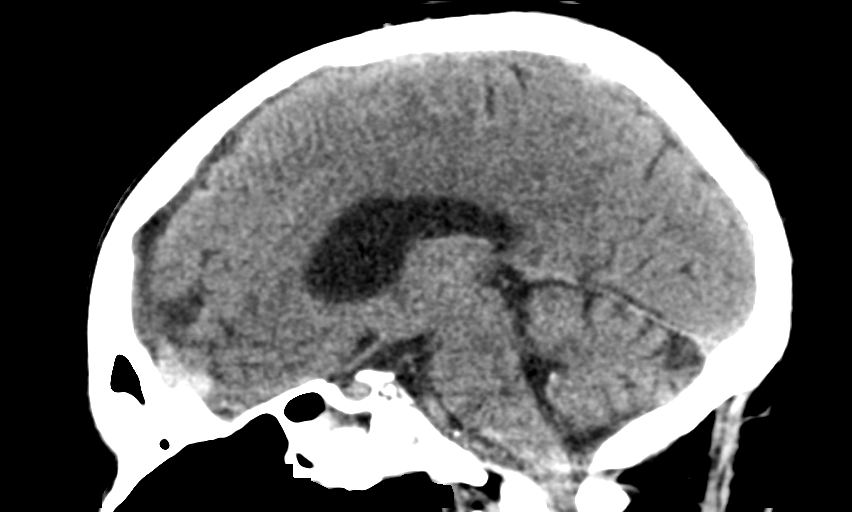
[im 45/67  brain]
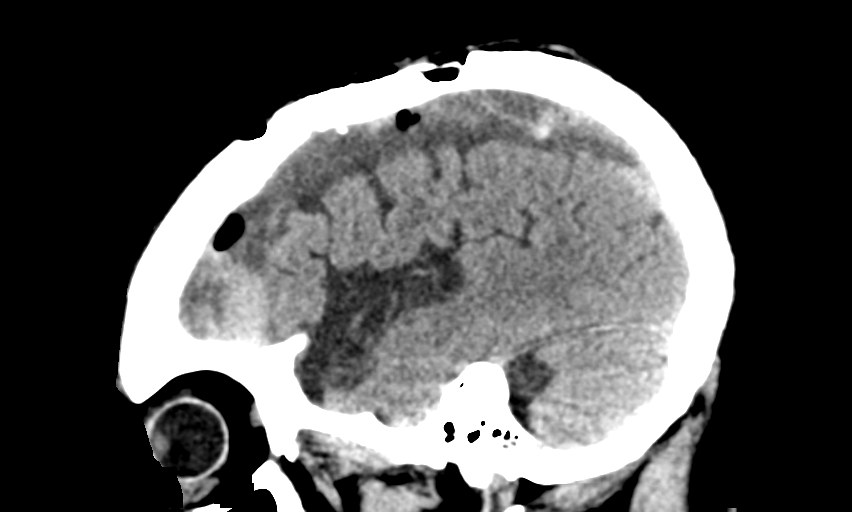

[15 of 47 positions shown; findings below may reference images not displayed]

FINDINGS: Brain: Status post bilateral bur holes for evacuation of large
bilateral subdural hematomas. Subdural drain in place on the right
but apparently not on the left. Marked reduction in subdural
collection thickness and mass effect upon the brain. Anteriorly,
subdural fluid and air measures 15 mm on the right and 11 mm on the
left. Posteriorly, mixed density subdural hematoma measures 8 mm on
the right and 11 mm on the left. Much less mass-effect upon the
brain. Right-to-left shift of 2 mm. No sign of ischemic infarction.
No hydrocephalus.

Vascular: There is atherosclerotic calcification of the major
vessels at the base of the brain.

Skull: Burr holes as noted above.

Sinuses/Orbits: Chronic inflammatory changes of the sphenoid sinus.
Orbits negative.

Other: Subluxation at C1-2 with spinal stenosis.
IMPRESSION: Bilateral subdural evacuation with much less mass effect upon the
brain. See above discussion. No complicating feature. Subdural
fluid, air and blood as expected. Drain in place on the right but
not apparently on the left.

## 2018-06-24 ENCOUNTER — Ambulatory Visit: Payer: Self-pay | Admitting: Internal Medicine

## 2018-07-14 ENCOUNTER — Encounter: Payer: Self-pay | Admitting: Internal Medicine

## 2018-07-14 ENCOUNTER — Ambulatory Visit (INDEPENDENT_AMBULATORY_CARE_PROVIDER_SITE_OTHER): Payer: Medicare HMO | Admitting: Internal Medicine

## 2018-07-14 ENCOUNTER — Other Ambulatory Visit: Payer: Self-pay

## 2018-07-14 VITALS — BP 120/60 | HR 66 | Temp 97.7°F | Ht 62.2 in | Wt 107.0 lb

## 2018-07-14 DIAGNOSIS — I10 Essential (primary) hypertension: Secondary | ICD-10-CM | POA: Diagnosis not present

## 2018-07-14 DIAGNOSIS — E7800 Pure hypercholesterolemia, unspecified: Secondary | ICD-10-CM | POA: Diagnosis not present

## 2018-07-14 DIAGNOSIS — D649 Anemia, unspecified: Secondary | ICD-10-CM | POA: Diagnosis not present

## 2018-07-14 DIAGNOSIS — D508 Other iron deficiency anemias: Secondary | ICD-10-CM

## 2018-07-14 DIAGNOSIS — H401111 Primary open-angle glaucoma, right eye, mild stage: Secondary | ICD-10-CM

## 2018-07-14 DIAGNOSIS — S065X9A Traumatic subdural hemorrhage with loss of consciousness of unspecified duration, initial encounter: Secondary | ICD-10-CM

## 2018-07-14 DIAGNOSIS — S065XAA Traumatic subdural hemorrhage with loss of consciousness status unknown, initial encounter: Secondary | ICD-10-CM

## 2018-07-14 DIAGNOSIS — H409 Unspecified glaucoma: Secondary | ICD-10-CM | POA: Insufficient documentation

## 2018-07-14 MED ORDER — AMLODIPINE BESYLATE 2.5 MG PO TABS: 2.5000 mg | ORAL_TABLET | Freq: Every day | ORAL | 0 refills | Status: DC

## 2018-07-14 NOTE — Progress Notes (Addendum)
Subjective:     Patient ID: Henry Harvey , male    DOB: 11-12-1926 , 85 y.o.   MRN: [id number]   Chief Complaint  Patient presents with  . New Patient (Initial Visit)    HPI Pt is a 85 y/o with hx of HTN, subdural hematomafrom a fall, and hyperlipidemia.  Pt is here to establish care with [place]. 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 Had subdural hematoma after he fained in March and had surgery to clear this out. Has no post surgical complications or effects.   He has never had a colonoscopy.     Past Medical History:  Diagnosis Date  . Dementia (HCC)   . Essential hypertension   . Hyperlipidemia      No family history on file.   Current Outpatient Medications:  .  aspirin 81 MG EC tablet, Take 81 mg by mouth daily., Disp: , Rfl: 3 .  atorvastatin (LIPITOR) 10 MG tablet, Take 10 mg by mouth daily.  , Disp: , Rfl:  .  dorzolamide-timolol (COSOPT) 22.3-6.8 MG/ML ophthalmic solution, Place 1 drop into the right eye 2 (two) times daily., Disp: , Rfl:  .  ENSURE (ENSURE), Take 237 mLs by mouth 2 (two) times daily between meals., Disp: , Rfl:  .  latanoprost (XALATAN) 0.005 % ophthalmic solution, Place 1 drop into both eyes at bedtime., Disp: , Rfl:  .  albuterol (PROVENTIL HFA;VENTOLIN HFA) 108 (90 BASE) MCG/ACT inhaler, Inhale 2 puffs into the lungs every 4 (four) hours as needed for wheezing or shortness of breath., Disp: 1 Inhaler, Rfl: 3 .  amLODipine (NORVASC) 2.5 MG tablet, Take 2.5 mg by mouth daily. , Disp: , Rfl:  .  ciprofloxacin (CIPRO) 500 MG tablet, Take 1 tablet (500 mg total) by mouth every 12 (twelve) hours. (Patient not taking: Reported on 05/28/2016), Disp: 14 tablet, Rfl: 0 .  metroNIDAZOLE (FLAGYL) 500 MG tablet, Take 1 tablet (500 mg total) by mouth 2 (two) times daily. (Patient not taking: Reported on  05/28/2016), Disp: 14 tablet, Rfl: 0 .  nitroGLYCERIN (NITRODUR - DOSED IN MG/24 HR) 0.2 mg/hr patch, Place 0.2 mg onto the skin daily. , Disp: , Rfl:  .  nitroGLYCERIN (NITROSTAT) 0.4 MG SL tablet, Place 0.4 mg under the tongue every 5 (five) minutes as needed for chest pain., Disp: , Rfl:  .  ondansetron (ZOFRAN ODT) 4 MG disintegrating tablet, Take 1 tablet (4 mg total) by mouth every 8 (eight) hours as needed for nausea or vomiting. (Patient not taking: Reported on 05/28/2016), Disp: 20 tablet, Rfl: 0   Allergies  Allergen Reactions  . Penicillins Rash and Other (See Comments)    Has patient had a PCN reaction causing immediate rash, facial/tongue/throat swelling, SOB or lightheadedness with hypotension: Yes Has patient had a PCN reaction causing severe rash involving mucus membranes or skin necrosis: No Has patient had a PCN reaction that required hospitalization: No Has patient had a PCN reaction occurring within the last 10 years: No If all of the above answers are "NO", then may proceed with Cephalosporin use.     Review of Systems  Respiratory: Negative for chest tightness and shortness of breath.   Cardiovascular: Negative for chest pain.  Gastrointestinal: Negative for abdominal pain, constipation, diarrhea, nausea and vomiting.  On occasion gets heart burn  Musculoskeletal: Negative for arthralgias, back pain, joint swelling, myalgias, neck pain and neck stiffness.  Skin: Negative for rash and wound.  Neurological: Positive for dizziness. Negative for speech difficulty, numbness and headaches.       On occasion gets a little light headed when getting up from sitting about once a week.   Hematological: Negative for adenopathy.  Psychiatric/Behavioral: Negative for decreased concentration.     Today's Vitals   07/14/18 0915  BP: 120/60  Pulse: 66  Temp: 97.7 F (36.5 C)  TempSrc: Oral  SpO2: 96%  Weight: 107 lb (48.5 kg)  Height: 5' 2.2" (1.58 m)   Body mass index  is 19.44 kg/m.   Objective:  Physical Exam  Constitutional: he is oriented to person, place, and time. he appears well-developed and well-nourished. No distress.  HENT:  Head: Normocephalic and atraumatic.  Right Ear: External ear normal.  Left Ear: External ear normal.  Nose: Nose normal.  Eyes: Conjunctivae are normal. Right eye exhibits no discharge. Left eye exhibits no discharge. No scleral icterus.  Neck: Neck supple. No thyromegaly present.  No carotid bruits bilaterally  Cardiovascular: Normal rate and regular rhythm.  No murmur heard. Pulmonary/Chest: Effort normal and breath sounds normal. No respiratory distress.  Musculoskeletal: Normal range of motion. She exhibits no edema.  Lymphadenopathy:he has no cervical adenopathy.  Neurological: he is alert and oriented to person, place, and time.  Skin: Skin is warm and dry. Capillary refill takes less than 2 seconds. No rash noted. She is not diaphoretic.  Psychiatric: he has a normal mood and affect. His behavior is normal. Judgment and thought content normal.  Nursing note reviewed.  Assessment And Plan:     1. Essential hypertension- stable. FU 3 months. I refilled his Norvasc.  - CMP14 + Anion Gap - CBC no Diff - amLODipine (NORVASC) 2.5 MG tablet; Take 1 tablet (2.5 mg total) by mouth daily.  Dispense: 90 tablet; Refill: 0  2. Pure hypercholesterolemia- chronic - Lipid Profile  3. Other iron deficiency anemia- chronic. Her his last labs his H/H was low.  - Iron and IBC ([id number]) - Ferritin  4. Anemia, unspecified type-chronic. I will re-check CBC and Fe, if not normal, then I will have him come in for rectal exam and check on hemoccult.   Brightyn Mozer RODRIGUEZ-SOUTHWORTH, PA-C

## 2018-07-14 NOTE — Patient Instructions (Signed)
Your blood work from the hospital shows you have anemia, I am checking this today as well.    Anemia  Anemia is a condition in which you do not have enough red blood cells or hemoglobin. Hemoglobin is a substance in red blood cells that carries oxygen. When you do not have enough red blood cells or hemoglobin (are anemic), your body cannot get enough oxygen and your organs may not work properly. As a result, you may feel very tired or have other problems. What are the causes? Common causes of anemia include:  Excessive bleeding. Anemia can be caused by excessive bleeding inside or outside the body, including bleeding from the intestine or from periods in women.  Poor nutrition.  Long-lasting (chronic) kidney, thyroid, and liver disease.  Bone marrow disorders.  Cancer and treatments for cancer.  HIV (human immunodeficiency virus) and AIDS (acquired immunodeficiency syndrome).  Treatments for HIV and AIDS.  Spleen problems.  Blood disorders.  Infections, medicines, and autoimmune disorders that destroy red blood cells. What are the signs or symptoms? Symptoms of this condition include:  Minor weakness.  Dizziness.  Headache.  Feeling heartbeats that are irregular or faster than normal (palpitations).  Shortness of breath, especially with exercise.  Paleness.  Cold sensitivity.  Indigestion.  Nausea.  Difficulty sleeping.  Difficulty concentrating. Symptoms may occur suddenly or develop slowly. If your anemia is mild, you may not have symptoms. How is this diagnosed? This condition is diagnosed based on:  Blood tests.  Your medical history.  A physical exam.  Bone marrow biopsy. Your health care provider may also check your stool (feces) for blood and may do additional testing to look for the cause of your bleeding. You may also have other tests, including:  Imaging tests, such as a CT scan or MRI.  Endoscopy.  Colonoscopy. How is this treated?  Treatment for this condition depends on the cause. If you continue to lose a lot of blood, you may need to be treated at a hospital. Treatment may include:  Taking supplements of iron, vitamin C78, or folic acid.  Taking a hormone medicine (erythropoietin) that can help to stimulate red blood cell growth.  Having a blood transfusion. This may be needed if you lose a lot of blood.  Making changes to your diet.  Having surgery to remove your spleen. Follow these instructions at home:  Take over-the-counter and prescription medicines only as told by your health care provider.  Take supplements only as told by your health care provider.  Follow any diet instructions that you were given.  Keep all follow-up visits as told by your health care provider. This is important. Contact a health care provider if:  You develop new bleeding anywhere in the body. Get help right away if:  You are very weak.  You are short of breath.  You have pain in your abdomen or chest.  You are dizzy or feel faint.  You have trouble concentrating.  You have bloody or black, tarry stools.  You vomit repeatedly or you vomit up blood. Summary  Anemia is a condition in which you do not have enough red blood cells or enough of a substance in your red blood cells that carries oxygen (hemoglobin).  Symptoms may occur suddenly or develop slowly.  If your anemia is mild, you may not have symptoms.  This condition is diagnosed with blood tests as well as a medical history and physical exam. Other tests may be needed.  Treatment for this  condition depends on the cause of the anemia. This information is not intended to replace advice given to you by your health care provider. Make sure you discuss any questions you have with your health care provider. Document Released: 05/22/2004 Document Revised: 05/16/2016 Document Reviewed: 05/16/2016 Elsevier Interactive Patient Education  2019 [employer].

## 2018-07-15 ENCOUNTER — Other Ambulatory Visit: Payer: Self-pay | Admitting: Internal Medicine

## 2018-07-15 LAB — CMP14 + ANION GAP
ALBUMIN: 3.7 g/dL (ref 3.6–4.6)
ALT: 14 IU/L (ref 0–44)
ANION GAP: 16 mmol/L (ref 10.0–18.0)
AST: 19 IU/L (ref 0–40)
Albumin/Globulin Ratio: 1.4 (ref 1.2–2.2)
Alkaline Phosphatase: 103 IU/L (ref 39–117)
BUN/Creatinine Ratio: 15 (ref 10–24)
BUN: 18 mg/dL (ref 8–27)
Bilirubin Total: 0.2 mg/dL (ref 0.0–1.2)
CO2: 21 mmol/L (ref 20–29)
CREATININE: 1.23 mg/dL (ref 0.76–1.27)
Calcium: 9.3 mg/dL (ref 8.6–10.2)
Chloride: 105 mmol/L (ref 96–106)
GFR calc Af Amer: 60 mL/min/{1.73_m2} (ref >59)
GFR, EST NON AFRICAN AMERICAN: 52 mL/min/{1.73_m2} — AB (ref >59)
GLOBULIN, TOTAL: 2.7 g/dL (ref 1.5–4.5)
Glucose: 90 mg/dL (ref 65–99)
Potassium: 4.3 mmol/L (ref 3.5–5.2)
SODIUM: 142 mmol/L (ref 134–144)
Total Protein: 6.4 g/dL (ref 6.0–8.5)

## 2018-07-15 LAB — CBC
HEMOGLOBIN: 13.4 g/dL (ref 13.0–17.7)
Hematocrit: 40.4 % (ref 37.5–51.0)
MCH: 29.8 pg (ref 26.6–33.0)
MCHC: 33.2 g/dL (ref 31.5–35.7)
MCV: 90 fL (ref 79–97)
PLATELETS: 222 10*3/uL (ref 150–450)
RBC: 4.49 x10E6/uL (ref 4.14–5.80)
RDW: 13.5 % (ref 11.6–15.4)
WBC: 6 10*3/uL (ref 3.4–10.8)

## 2018-07-15 LAB — IRON AND TIBC
Iron Saturation: 16 % (ref 15–55)
Iron: 48 ug/dL (ref 38–169)
TIBC: 307 ug/dL (ref 250–450)
UIBC: 259 ug/dL (ref 111–343)

## 2018-07-15 LAB — FERRITIN: FERRITIN: 91 ng/mL (ref 30–400)

## 2018-07-15 LAB — LIPID PANEL
Chol/HDL Ratio: 3.6 ratio (ref 0.0–5.0)
Cholesterol, Total: 160 mg/dL (ref 100–199)
HDL: 44 mg/dL (ref >39)
LDL CALC: 101 mg/dL — AB (ref 0–99)
TRIGLYCERIDES: 76 mg/dL (ref 0–149)
VLDL CHOLESTEROL CAL: 15 mg/dL (ref 5–40)

## 2018-07-15 NOTE — Progress Notes (Signed)
Anemia problem resolved. Problem list was updated.

## 2018-07-16 ENCOUNTER — Telehealth: Payer: Self-pay

## 2018-07-16 NOTE — Telephone Encounter (Signed)
1st attempt to give results 

## 2018-07-16 NOTE — Telephone Encounter (Signed)
-----   Message from Garey Ham, [place] sent at 07/15/2018  7:56 AM EDT ----- Please inform pt all his labs are normal, except his bad cholesterol is just 2 points above the normal. His anemia has resolved.

## 2018-08-24 ENCOUNTER — Telehealth: Payer: Self-pay | Admitting: Internal Medicine

## 2018-08-24 NOTE — Telephone Encounter (Signed)
I left a message asking the patient to call me at [phone] to schedule AWV with [place]. I also tried calling the home number, but there was no answer and no option to leave a message. VDM (DD) °

## 2018-08-30 ENCOUNTER — Telehealth: Payer: Self-pay | Admitting: Internal Medicine

## 2018-08-30 NOTE — Telephone Encounter (Signed)
PT RETURNED CALL, AWV VISIT WAS ADDED TO PT 06/18 VISIT TO PREVENT MULTIPLE APPTS TO OFFICE.

## 2018-08-30 NOTE — Telephone Encounter (Signed)
2nd attempt to schedule AWV.Marland KitchenMarland Kitchenleft message

## 2018-10-14 ENCOUNTER — Other Ambulatory Visit: Payer: Self-pay

## 2018-10-14 ENCOUNTER — Ambulatory Visit (INDEPENDENT_AMBULATORY_CARE_PROVIDER_SITE_OTHER): Payer: Medicare HMO

## 2018-10-14 ENCOUNTER — Encounter: Payer: Self-pay | Admitting: Internal Medicine

## 2018-10-14 ENCOUNTER — Ambulatory Visit (INDEPENDENT_AMBULATORY_CARE_PROVIDER_SITE_OTHER): Payer: Medicare HMO | Admitting: Internal Medicine

## 2018-10-14 VITALS — BP 116/60 | HR 80 | Temp 98.2°F | Ht 62.2 in | Wt 107.0 lb

## 2018-10-14 DIAGNOSIS — R739 Hyperglycemia, unspecified: Secondary | ICD-10-CM

## 2018-10-14 DIAGNOSIS — Z0000 Encounter for general adult medical examination without abnormal findings: Secondary | ICD-10-CM

## 2018-10-14 DIAGNOSIS — I10 Essential (primary) hypertension: Secondary | ICD-10-CM

## 2018-10-14 DIAGNOSIS — E7800 Pure hypercholesterolemia, unspecified: Secondary | ICD-10-CM | POA: Diagnosis not present

## 2018-10-14 LAB — POCT URINALYSIS DIPSTICK
Bilirubin, UA: NEGATIVE
Blood, UA: NEGATIVE
Glucose, UA: NEGATIVE
Ketones, UA: NEGATIVE
Leukocytes, UA: NEGATIVE
Nitrite, UA: NEGATIVE
Protein, UA: POSITIVE — AB
Spec Grav, UA: 1.025 (ref 1.010–1.025)
Urobilinogen, UA: 0.2 E.U./dL
pH, UA: 5.5 (ref 5.0–8.0)

## 2018-10-14 LAB — POCT UA - MICROALBUMIN
Albumin/Creatinine Ratio, Urine, POC: <30
Creatinine, POC: 300 mg/dL
Microalbumin Ur, POC: 30 mg/L

## 2018-10-14 NOTE — Progress Notes (Addendum)
Subjective:     Patient ID: Henry Harvey , male    DOB: 06/08/1926 , 85 y.o.   MRN: [id number]   Chief Complaint  Patient presents with  . Anemia  . Hypertension    HPI Pt is here for FU HTN , medicare wellness visit and Anemia, but per his last labs, his anemia had resolved. He states he has been doing well, but does not have much energy. He denies sleepiness, but rest all day off and on. He does not do any exercises at home or goes out for walks. He still lives by himself and denies having any complaints.    Past Medical History:  Diagnosis Date  . Dementia (HCC)   . Essential hypertension   . Hyperlipidemia      No family history on file.   Current Outpatient Medications:  .  amLODipine (NORVASC) 2.5 MG tablet, Take 1 tablet (2.5 mg total) by mouth daily., Disp: 90 tablet, Rfl: 0 .  aspirin 81 MG EC tablet, Take 81 mg by mouth daily., Disp: , Rfl: 3 .  atorvastatin (LIPITOR) 10 MG tablet, Take 10 mg by mouth daily.  , Disp: , Rfl:  .  dorzolamide-timolol (COSOPT) 22.3-6.8 MG/ML ophthalmic solution, Place 1 drop into the right eye 2 (two) times daily., Disp: , Rfl:  .  ENSURE (ENSURE), Take 237 mLs by mouth 2 (two) times daily between meals., Disp: , Rfl:  .  latanoprost (XALATAN) 0.005 % ophthalmic solution, Place 1 drop into both eyes at bedtime., Disp: , Rfl:    Allergies  Allergen Reactions  . Penicillins Rash and Other (See Comments)    Has patient had a PCN reaction causing immediate rash, facial/tongue/throat swelling, SOB or lightheadedness with hypotension: Yes Has patient had a PCN reaction causing severe rash involving mucus membranes or skin necrosis: No Has patient had a PCN reaction that required hospitalization: No Has patient had a PCN reaction occurring within the last 10 years: No If all of the above answers are "NO", then may proceed with Cephalosporin use.     Review of Systems  Review of Systems  Constitutional: Negative for diaphoresis and  unexpected weight change.  HENT: Negative for tinnitus.   Eyes: Negative for visual disturbance.  Respiratory: Negative for chest tightness and shortness of breath. Cardiovascular: Negative for chest pain, palpitations and leg swelling.  Gastrointestinal: Negative for constipation, diarrhea and nausea.  Endocrine: Negative for polydipsia, polyphagia and polyuria.  Genitourinary: Negative for dysuria and frequency. Gets up x 2 at night time to void.  Skin: Negative for rash and wound.  Neurological: Negative for dizziness, speech difficulty, weakness, numbness and headaches.   Today's Vitals   10/14/18 0845  BP: 116/60  Pulse: 80  Temp: 98.2 F (36.8 C)  TempSrc: Oral  SpO2: 97%  Weight: 110 lb 12.8 oz (50.3 kg)  Height: 5' 2.2" (1.58 m)   Body mass index is 20.14 kg/m.   Objective:  Physical Exam  Weight is up 3 lbs from last time.  Constitutional: he is oriented to person, place, and time. She appears well-developed and well-nourished. No distress.  HENT: he wears glasses Head: Normocephalic and atraumatic.  Right Ear: External ear normal.  Left Ear: External ear normal.  Nose: Nose normal.  Eyes: Conjunctivae are normal. Right eye exhibits no discharge. Left eye exhibits no discharge. No scleral icterus.  Neck: Neck supple. No thyromegaly present.  No carotid bruits bilaterally  Cardiovascular: Normal rate and regular rhythm.  No murmur heard.  Pulmonary/Chest: Effort normal and breath sounds normal. No respiratory distress.  Abdomen- + BS, soft, no bruits heard. No HSM or masses palpated.  Musculoskeletal: Normal range of motion. he exhibits no edema.  Lymphadenopathy: he has no cervical adenopathy.  Neurological: he is alert and oriented to person, place, and time.  Skin: Skin is warm and dry. Capillary refill takes less than 2 seconds. No rash noted. he is not diaphoretic.  Psychiatric: he has a normal mood and affect. His behavior is normal. Judgment and thought  content normal.  Nursing note reviewed. Assessment And Plan:   1. Essential hypertension- stable. May continue current med.   Fu 3 months.  - CBC no Diff - CMP14 + Anion Gap  2. Pure hypercholesterolemia- chronic. May continue current  med.  - Lipid Profile - TSH - T4, Free - T3, free  3. History of hyperglycemia- stable      HGBA1C ordered,   UA shows trace protein. Microalbuminuria is normal.  Nazaret Chea RODRIGUEZ-SOUTHWORTH, PA-C    THE PATIENT IS ENCOURAGED TO PRACTICE SOCIAL DISTANCING DUE TO THE COVID-19 PANDEMIC.

## 2018-10-14 NOTE — Progress Notes (Signed)
Subjective:   Henry Harvey is a 85 y.o. male who presents for Medicare Annual/Subsequent preventive examination.  Review of Systems:  n/a Cardiac Risk Factors include: advanced age (>6755men, 91>65 women);hypertension;dyslipidemia;male gender;sedentary lifestyle     Objective:    Vitals: BP 116/60 (Patient Position: Sitting)   Pulse 80   Temp 98.2 F (36.8 C) (Oral)   Ht 5' 2.2" (1.58 m)   Wt 107 lb (48.5 kg)   SpO2 97%   BMI 19.44 kg/m   Body mass index is 19.44 kg/m.  Advanced Directives 10/14/2018 05/29/2016 05/28/2016 04/05/2016 02/26/2016  Does Patient Have a Medical Advance Directive? Yes Yes No No No  Type of Estate agentAdvance Directive Healthcare Power of [place];Living will Living will - - -  Does patient want to make changes to medical advance directive? - No - Patient declined - - -  Copy of Healthcare Power of Attorney in Chart? No - copy requested - - - -  Would patient like information on creating a medical advance directive? - - No - Patient declined No - Patient declined No - patient declined information    Tobacco Social History   Tobacco Use  Smoking Status Former Smoker  . Types: Cigarettes  . Start date: 07/14/1946  . Quit date: 07/14/1983  . Years since quitting: 35.2  Smokeless Tobacco Never Used  Tobacco Comment   21/2 PPD     Counseling given: Not Answered Comment: 21/2 PPD   Clinical Intake:  Pre-visit preparation completed: Yes  Pain : No/denies pain Pain Score: 0-No pain     Nutritional Status: BMI of 19-24  Normal Nutritional Risks: None Diabetes: No  How often do you need to have someone help you when you read instructions, pamphlets, or other written materials from your doctor or pharmacy?: 1 - Never What is the last grade level you completed in school?: some college  Interpreter Needed?: No  Information entered by :: NAllen LPN  Past Medical History:  Diagnosis Date  . Dementia (HCC)   . Essential hypertension   . Hyperlipidemia    Past Surgical History:  Procedure Laterality Date  . BURR HOLE Bilateral 05/28/2016   Procedure: BURR HOLES FOR SUBDURAL HEMATOMA;  Surgeon: Donalee CitrinGary Cram, MD;  [facility];  Service: Neurosurgery;  Laterality: Bilateral;  . BURR HOLE FOR SUBDURAL HEMATOMA Bilateral    History reviewed. No pertinent family history. Social History   Socioeconomic History  . Marital status: Married    Spouse name: Not on file  . Number of children: Not on file  . Years of education: Not on file  . Highest education level: Not on file  Occupational History  . Occupation: retired  Engineer, productionocial Needs  . Financial resource strain: Not hard at all  . Food insecurity    Worry: Never true    Inability: Never true  . Transportation needs    Medical: No    Non-medical: No  Tobacco Use  . Smoking status: Former Smoker    Types: Cigarettes    Start date: 07/14/1946    Quit date: 07/14/1983    Years since quitting: 35.2  . Smokeless tobacco: Never Used  . Tobacco comment: 21/2 PPD  Substance and Sexual Activity  . Alcohol use: No  . Drug use: No  . Sexual activity: Not Currently  Lifestyle  . Physical activity    Days per week: 0 days    Minutes per session: 0 min  . Stress: Not at all  Relationships  . Social  connections    Talks on phone: Not on file    Gets together: Not on file    Attends religious service: Not on file    Active member of club or organization: Not on file    Attends meetings of clubs or organizations: Not on file    Relationship status: Not on file  Other Topics Concern  . Not on file  Social History Narrative  . Not on file    Outpatient Encounter Medications as of 10/14/2018  Medication Sig  . amLODipine (NORVASC) 2.5 MG tablet Take 1 tablet (2.5 mg total) by mouth daily.  Marland Kitchen. aspirin 81 MG EC tablet Take 81 mg by mouth daily.  Marland Kitchen. atorvastatin (LIPITOR) 10 MG tablet Take 10 mg by mouth daily.    . dorzolamide-timolol (COSOPT) 22.3-6.8 MG/ML ophthalmic solution Place 1 drop  into the right eye 2 (two) times daily.  Marland Kitchen. ENSURE (ENSURE) Take 237 mLs by mouth 2 (two) times daily between meals.  . latanoprost (XALATAN) 0.005 % ophthalmic solution Place 1 drop into both eyes at bedtime.   No facility-administered encounter medications on file as of 10/14/2018.     Activities of Daily Living In your present state of health, do you have any difficulty performing the following activities: 10/14/2018  Hearing? N  Vision? N  Difficulty concentrating or making decisions? N  Walking or climbing stairs? Y  Dressing or bathing? N  Doing errands, shopping? N  Preparing Food and eating ? N  Using the Toilet? N  In the past six months, have you accidently leaked urine? N  Do you have problems with loss of bowel control? N  Managing your Medications? N  Managing your Finances? N  Housekeeping or managing your Housekeeping? N  Some recent data might be hidden    Patient Care Team: Rodriguez-Southworth, Viviana SimplerSylvia, PA-C as PCP - General (Internal Medicine) Rinaldo CloudHarwani, Mohan, MD as Consulting Physician (Cardiology)   Assessment:   This is a routine wellness examination for Henry Harvey.  Exercise Activities and Dietary recommendations Current Exercise Habits: The patient does not participate in regular exercise at present  Goals    . Patient Stated     No goals       Fall Risk Fall Risk  10/14/2018 10/14/2018 07/14/2018  Falls in the past year? 0 0 1  Number falls in past yr: - - 0  Injury with Fall? - - 1  Risk for fall due to : Medication side effect - -  Follow up Education provided;Falls prevention discussed - -   Is the patient's home free of loose throw rugs in walkways, pet beds, electrical cords, etc?   yes      Grab bars in the bathroom? no      Handrails on the stairs?   no      Adequate lighting?   yes  Timed Get Up and Go Performed: n/a  Depression Screen PHQ 2/9 Scores 10/14/2018 10/14/2018 07/14/2018  PHQ - 2 Score 0 0 0  PHQ- 9 Score 0 - -    Cognitive  Function     6CIT Screen 10/14/2018  What Year? 0 points  What month? 0 points  What time? 0 points  Count back from 20 0 points  Months in reverse 0 points  Repeat phrase 10 points  Total Score 10     There is no immunization history on file for this patient.  Qualifies for Shingles Vaccine? yes  Screening Tests Health Maintenance  Topic Date Due  .  TETANUS/TDAP  10/14/2019 (Originally 01/04/1946)  . PNA vac Low Risk Adult (1 of 2 - PCV13) 10/14/2019 (Originally 01/03/1994)  . INFLUENZA VACCINE  11/27/2018   Cancer Screenings: Lung: Low Dose CT Chest recommended if Age 42-80 years, 30 pack-year currently smoking OR have quit w/in 15years. Patient does not qualify. Colorectal: not required  Additional Screenings:  Hepatitis C Screening:n/a      Plan:    No goals. Declined prevnar and TDAP  I have personally reviewed and noted the following in the patient's chart:   . Medical and social history . Use of alcohol, tobacco or illicit drugs  . Current medications and supplements . Functional ability and status . Nutritional status . Physical activity . Advanced directives . List of other physicians . Hospitalizations, surgeries, and ER visits in previous 12 months . Vitals . Screenings to include cognitive, depression, and falls . Referrals and appointments  In addition, I have reviewed and discussed with patient certain preventive protocols, quality metrics, and best practice recommendations. A written personalized care plan for preventive services as well as general preventive health recommendations were provided to patient.     Kellie Simmering, LPN  1/47/0929

## 2018-10-14 NOTE — Patient Instructions (Signed)
Henry Harvey , Thank you for taking time to come for your Medicare Wellness Visit. I appreciate your ongoing commitment to your health goals. Please review the following plan we discussed and let me know if I can assist you in the future.   Screening recommendations/referrals: Colonoscopy: not required Recommended yearly ophthalmology/optometry visit for glaucoma screening and checkup Recommended yearly dental visit for hygiene and checkup  Vaccinations: Influenza vaccine: 02/2018 Pneumococcal vaccine: declines Tdap vaccine: declines Shingles vaccine: discussed    Advanced directives: Please bring a copy of your POA (Power of Attorney) and/or Living Will to your next appointment.    Conditions/risks identified: none  Next appointment: 01/20/2019 at 9:00  Preventive Care 65 Years and Older, Male Preventive care refers to lifestyle choices and visits with your health care provider that can promote health and wellness. What does preventive care include?  A yearly physical exam. This is also called an annual well check.  Dental exams once or twice a year.  Routine eye exams. Ask your health care provider how often you should have your eyes checked.  Personal lifestyle choices, including:  Daily care of your teeth and gums.  Regular physical activity.  Eating a healthy diet.  Avoiding tobacco and drug use.  Limiting alcohol use.  Practicing safe sex.  Taking low doses of aspirin every day.  Taking vitamin and mineral supplements as recommended by your health care provider. What happens during an annual well check? The services and screenings done by your health care provider during your annual well check will depend on your age, overall health, lifestyle risk factors, and family history of disease. Counseling  Your health care provider may ask you questions about your:  Alcohol use.  Tobacco use.  Drug use.  Emotional well-being.  Home and relationship  well-being.  Sexual activity.  Eating habits.  History of falls.  Memory and ability to understand (cognition).  Work and work Statistician. Screening  You may have the following tests or measurements:  Height, weight, and BMI.  Blood pressure.  Lipid and cholesterol levels. These may be checked every 5 years, or more frequently if you are over 37 years old.  Skin check.  Lung cancer screening. You may have this screening every year starting at age 33 if you have a 30-pack-year history of smoking and currently smoke or have quit within the past 15 years.  Fecal occult blood test (FOBT) of the stool. You may have this test every year starting at age 32.  Flexible sigmoidoscopy or colonoscopy. You may have a sigmoidoscopy every 5 years or a colonoscopy every 10 years starting at age 29.  Prostate cancer screening. Recommendations will vary depending on your family history and other risks.  Hepatitis C blood test.  Hepatitis B blood test.  Sexually transmitted disease (STD) testing.  Diabetes screening. This is done by checking your blood sugar (glucose) after you have not eaten for a while (fasting). You may have this done every 1-3 years.  Abdominal aortic aneurysm (AAA) screening. You may need this if you are a current or former smoker.  Osteoporosis. You may be screened starting at age 16 if you are at high risk. Talk with your health care provider about your test results, treatment options, and if necessary, the need for more tests. Vaccines  Your health care provider may recommend certain vaccines, such as:  Influenza vaccine. This is recommended every year.  Tetanus, diphtheria, and acellular pertussis (Tdap, Td) vaccine. You may need a Td booster  every 10 years.  Zoster vaccine. You may need this after age 18.  Pneumococcal 13-valent conjugate (PCV13) vaccine. One dose is recommended after age 67.  Pneumococcal polysaccharide (PPSV23) vaccine. One dose is  recommended after age 15. Talk to your health care provider about which screenings and vaccines you need and how often you need them. This information is not intended to replace advice given to you by your health care provider. Make sure you discuss any questions you have with your health care provider. Document Released: 05/11/2015 Document Revised: 01/02/2016 Document Reviewed: 02/13/2015 Elsevier Interactive Patient Education  2017 [place] Prevention in the Home Falls can cause injuries. They can happen to people of all ages. There are many things you can do to make your home safe and to help prevent falls. What can I do on the outside of my home?  Regularly fix the edges of walkways and driveways and fix any cracks.  Remove anything that might make you trip as you walk through a door, such as a raised step or threshold.  Trim any bushes or trees on the path to your home.  Use bright outdoor lighting.  Clear any walking paths of anything that might make someone trip, such as rocks or tools.  Regularly check to see if handrails are loose or broken. Make sure that both sides of any steps have handrails.  Any raised decks and porches should have guardrails on the edges.  Have any leaves, snow, or ice cleared regularly.  Use sand or salt on walking paths during winter.  Clean up any spills in your garage right away. This includes oil or grease spills. What can I do in the bathroom?  Use night lights.  Install grab bars by the toilet and in the tub and shower. Do not use towel bars as grab bars.  Use non-skid mats or decals in the tub or shower.  If you need to sit down in the shower, use a plastic, non-slip stool.  Keep the floor dry. Clean up any water that spills on the floor as soon as it happens.  Remove soap buildup in the tub or shower regularly.  Attach bath mats securely with double-sided non-slip rug tape.  Do not have throw rugs and other things on  the floor that can make you trip. What can I do in the bedroom?  Use night lights.  Make sure that you have a light by your bed that is easy to reach.  Do not use any sheets or blankets that are too big for your bed. They should not hang down onto the floor.  Have a firm chair that has side arms. You can use this for support while you get dressed.  Do not have throw rugs and other things on the floor that can make you trip. What can I do in the kitchen?  Clean up any spills right away.  Avoid walking on wet floors.  Keep items that you use a lot in easy-to-reach places.  If you need to reach something above you, use a strong step stool that has a grab bar.  Keep electrical cords out of the way.  Do not use floor polish or wax that makes floors slippery. If you must use wax, use non-skid floor wax.  Do not have throw rugs and other things on the floor that can make you trip. What can I do with my stairs?  Do not leave any items on the stairs.  Make  sure that there are handrails on both sides of the stairs and use them. Fix handrails that are broken or loose. Make sure that handrails are as long as the stairways.  Check any carpeting to make sure that it is firmly attached to the stairs. Fix any carpet that is loose or worn.  Avoid having throw rugs at the top or bottom of the stairs. If you do have throw rugs, attach them to the floor with carpet tape.  Make sure that you have a light switch at the top of the stairs and the bottom of the stairs. If you do not have them, ask someone to add them for you. What else can I do to help prevent falls?  Wear shoes that:  Do not have high heels.  Have rubber bottoms.  Are comfortable and fit you well.  Are closed at the toe. Do not wear sandals.  If you use a stepladder:  Make sure that it is fully opened. Do not climb a closed stepladder.  Make sure that both sides of the stepladder are locked into place.  Ask someone to  hold it for you, if possible.  Clearly mark and make sure that you can see:  Any grab bars or handrails.  First and last steps.  Where the edge of each step is.  Use tools that help you move around (mobility aids) if they are needed. These include:  Canes.  Walkers.  Scooters.  Crutches.  Turn on the lights when you go into a dark area. Replace any light bulbs as soon as they burn out.  Set up your furniture so you have a clear path. Avoid moving your furniture around.  If any of your floors are uneven, fix them.  If there are any pets around you, be aware of where they are.  Review your medicines with your doctor. Some medicines can make you feel dizzy. This can increase your chance of falling. Ask your doctor what other things that you can do to help prevent falls. This information is not intended to replace advice given to you by your health care provider. Make sure you discuss any questions you have with your health care provider. Document Released: 02/08/2009 Document Revised: 09/20/2015 Document Reviewed: 05/19/2014 Elsevier Interactive Patient Education  2017 [employer].

## 2018-10-15 LAB — CMP14 + ANION GAP
ALT: 25 IU/L (ref 0–44)
AST: 26 IU/L (ref 0–40)
Albumin/Globulin Ratio: 1.4 (ref 1.2–2.2)
Albumin: 4.2 g/dL (ref 3.6–4.6)
Alkaline Phosphatase: 113 IU/L (ref 39–117)
Anion Gap: 16 mmol/L (ref 10.0–18.0)
BUN/Creatinine Ratio: 15 (ref 10–24)
BUN: 19 mg/dL (ref 8–27)
Bilirubin Total: 0.4 mg/dL (ref 0.0–1.2)
CO2: 21 mmol/L (ref 20–29)
Calcium: 9.8 mg/dL (ref 8.6–10.2)
Chloride: 105 mmol/L (ref 96–106)
Creatinine, Ser: 1.26 mg/dL (ref 0.76–1.27)
GFR calc Af Amer: 58 mL/min/{1.73_m2} — ABNORMAL LOW (ref >59)
GFR calc non Af Amer: 50 mL/min/{1.73_m2} — ABNORMAL LOW (ref >59)
Globulin, Total: 2.9 g/dL (ref 1.5–4.5)
Glucose: 113 mg/dL — ABNORMAL HIGH (ref 65–99)
Potassium: 4.4 mmol/L (ref 3.5–5.2)
Sodium: 142 mmol/L (ref 134–144)
Total Protein: 7.1 g/dL (ref 6.0–8.5)

## 2018-10-15 LAB — CBC
Hematocrit: 43.9 % (ref 37.5–51.0)
Hemoglobin: 14.8 g/dL (ref 13.0–17.7)
MCH: 29.8 pg (ref 26.6–33.0)
MCHC: 33.7 g/dL (ref 31.5–35.7)
MCV: 88 fL (ref 79–97)
Platelets: 223 10*3/uL (ref 150–450)
RBC: 4.97 x10E6/uL (ref 4.14–5.80)
RDW: 13.4 % (ref 11.6–15.4)
WBC: 5.5 10*3/uL (ref 3.4–10.8)

## 2018-10-15 LAB — LIPID PANEL
Chol/HDL Ratio: 3.1 ratio (ref 0.0–5.0)
Cholesterol, Total: 147 mg/dL (ref 100–199)
HDL: 47 mg/dL (ref >39)
LDL Calculated: 85 mg/dL (ref 0–99)
Triglycerides: 77 mg/dL (ref 0–149)
VLDL Cholesterol Cal: 15 mg/dL (ref 5–40)

## 2018-10-15 LAB — TSH: TSH: 2.31 u[IU]/mL (ref 0.450–4.500)

## 2018-10-15 LAB — HEMOGLOBIN A1C
Est. average glucose Bld gHb Est-mCnc: 123 mg/dL
Hgb A1c MFr Bld: 5.9 % — ABNORMAL HIGH (ref 4.8–5.6)

## 2018-10-15 LAB — T3, FREE: T3, Free: 2.9 pg/mL (ref 2.0–4.4)

## 2018-10-15 LAB — T4, FREE: Free T4: 1.32 ng/dL (ref 0.82–1.77)

## 2018-10-25 ENCOUNTER — Telehealth: Payer: Self-pay

## 2018-10-25 NOTE — Telephone Encounter (Signed)
1st attempt to give lab results  

## 2018-10-25 NOTE — Telephone Encounter (Signed)
-----   Message from Shelby Mattocks, [place] sent at 10/25/2018 11:47 AM EDT ----- Please inform Henry Harvey that all his labs are normal, except his kidney function is a little slow and needs to hydrate more with water and avoid any type of Ibuprofen medications, and his diabetes number is at prediabetes level. Needs to avoid too much sweets.

## 2018-12-23 ENCOUNTER — Emergency Department (HOSPITAL_COMMUNITY): Payer: Medicare HMO

## 2018-12-23 ENCOUNTER — Emergency Department (HOSPITAL_COMMUNITY)
Admission: EM | Admit: 2018-12-23 | Discharge: 2018-12-23 | Disposition: A | Discharge status: Home / Self Care | Payer: Medicare HMO | Attending: Emergency Medicine | Admitting: Emergency Medicine

## 2018-12-23 DIAGNOSIS — Z7982 Long term (current) use of aspirin: Secondary | ICD-10-CM | POA: Insufficient documentation

## 2018-12-23 DIAGNOSIS — Z8679 Personal history of other diseases of the circulatory system: Secondary | ICD-10-CM | POA: Diagnosis not present

## 2018-12-23 DIAGNOSIS — Z79899 Other long term (current) drug therapy: Secondary | ICD-10-CM | POA: Insufficient documentation

## 2018-12-23 DIAGNOSIS — Z87891 Personal history of nicotine dependence: Secondary | ICD-10-CM | POA: Diagnosis not present

## 2018-12-23 DIAGNOSIS — I10 Essential (primary) hypertension: Secondary | ICD-10-CM | POA: Insufficient documentation

## 2018-12-23 DIAGNOSIS — R42 Dizziness and giddiness: Secondary | ICD-10-CM

## 2018-12-23 DIAGNOSIS — R410 Disorientation, unspecified: Secondary | ICD-10-CM | POA: Diagnosis not present

## 2018-12-23 DIAGNOSIS — F0390 Unspecified dementia without behavioral disturbance: Secondary | ICD-10-CM | POA: Insufficient documentation

## 2018-12-23 LAB — BASIC METABOLIC PANEL
Anion gap: 11 (ref 5–15)
BUN: 24 mg/dL — ABNORMAL HIGH (ref 8–23)
CO2: 23 mmol/L (ref 22–32)
Calcium: 9.1 mg/dL (ref 8.9–10.3)
Chloride: 106 mmol/L (ref 98–111)
Creatinine, Ser: 1.18 mg/dL (ref 0.61–1.24)
GFR calc Af Amer: >60 mL/min (ref >60)
GFR calc non Af Amer: 54 mL/min — ABNORMAL LOW (ref >60)
Glucose, Bld: 125 mg/dL — ABNORMAL HIGH (ref 70–99)
Potassium: 4.6 mmol/L (ref 3.5–5.1)
Sodium: 140 mmol/L (ref 135–145)

## 2018-12-23 LAB — CBC
HCT: 42.6 % (ref 39.0–52.0)
Hemoglobin: 13.6 g/dL (ref 13.0–17.0)
MCH: 29.6 pg (ref 26.0–34.0)
MCHC: 31.9 g/dL (ref 30.0–36.0)
MCV: 92.8 fL (ref 80.0–100.0)
Platelets: 229 10*3/uL (ref 150–400)
RBC: 4.59 MIL/uL (ref 4.22–5.81)
RDW: 13.5 % (ref 11.5–15.5)
WBC: 5.2 10*3/uL (ref 4.0–10.5)
nRBC: 0 % (ref 0.0–0.2)

## 2018-12-23 MED ORDER — SODIUM CHLORIDE 0.9 % IV BOLUS
1000.0000 mL | Freq: Once | INTRAVENOUS | Status: AC
  Administered 2018-12-23: 1000 mL via INTRAVENOUS

## 2018-12-23 MED ORDER — MECLIZINE HCL 25 MG PO TABS
25.0000 mg | ORAL_TABLET | Freq: Once | ORAL | Status: AC
  Administered 2018-12-23: 22:00:00 25 mg via ORAL
  Filled 2018-12-23: qty 1

## 2018-12-23 MED ORDER — MECLIZINE HCL 12.5 MG PO TABS: 12.5000 mg | ORAL_TABLET | Freq: Two times a day (BID) | ORAL | 0 refills | Status: AC | PRN

## 2018-12-23 NOTE — ED Triage Notes (Signed)
Pt to ED via EMS from home. C/o dizziness. EMS noting orthostatic changes, around 20 points systolic; and increase in heart rate from sitting to standing. . Orientation at baseline. #20Lforearm, 500 mb bolus given.

## 2018-12-23 NOTE — ED Notes (Signed)
Patient transported to MRI 

## 2018-12-23 NOTE — ED Notes (Signed)
Daughter at bedside.

## 2018-12-23 NOTE — Discharge Instructions (Signed)
Stay hydrated   Take meclizine as needed for dizziness   See your doctor   Return to ER if you have worse dizziness, vomiting, passing out, weakness

## 2018-12-23 NOTE — ED Provider Notes (Signed)
[facility] Note   CSN: [id number] Arrival date & time: 12/23/18  1603     History   Chief Complaint Chief Complaint  Patient presents with  . Near Syncope    HPI Henry Harvey is a 85 y.o. male history dementia, previous.  Subdural hemorrhage to presenting with dizziness. Patient states that he woke up and he felt lightheaded and dizzy.  Initially he felt that he was on pass out but he actually states that he felt like the room was spinning.  He states that he felt unsteady when he walks. Denies any head injury or loss of consciousness.  Denies any chest pain or shortness of breath.  EMS noted that he may be orthostatic.  He was given 500 cc IV fluids prior to arrival.  Patient was admitted about a year ago for subdural hemorrhage and had bilateral bur holes.      The history is provided by the patient.    Past Medical History:  Diagnosis Date  . Dementia (HCC)   . Essential hypertension   . Hyperlipidemia     Patient Active Problem List   Diagnosis Date Noted  . Glaucoma, right eye   . Hypertension   . Hyperlipidemia   . Syncope   . Dementia without behavioral disturbance (HCC)   . Hyperglycemia   . SDH (subdural hematoma) (HCC) 05/28/2016  . Subdural hematoma (HCC) 05/28/2016    Past Surgical History:  Procedure Laterality Date  . BURR HOLE Bilateral 05/28/2016   Procedure: BURR HOLES FOR SUBDURAL HEMATOMA;  Surgeon: Donalee CitrinGary Cram, MD;  [facility];  Service: Neurosurgery;  Laterality: Bilateral;  . BURR HOLE FOR SUBDURAL HEMATOMA Bilateral         Home Medications    Prior to Admission medications   Medication Sig Start Date End Date Taking? Authorizing Provider  amLODipine (NORVASC) 2.5 MG tablet Take 1 tablet (2.5 mg total) by mouth daily. 07/14/18  Yes Rodriguez-Southworth, Viviana SimplerSylvia, PA-C  aspirin 81 MG EC tablet Take 81 mg by mouth daily. 04/26/16  Yes [provider]  atorvastatin (LIPITOR) 20 MG tablet  Take 20 mg by mouth daily. 12/07/18  Yes [provider]  dorzolamide-timolol (COSOPT) 22.3-6.8 MG/ML ophthalmic solution Place 1 drop into the right eye 2 (two) times daily.   Yes [provider]  ENSURE (ENSURE) Take 237 mLs by mouth 2 (two) times daily between meals.   Yes [provider]  latanoprost (XALATAN) 0.005 % ophthalmic solution Place 1 drop into both eyes at bedtime.   Yes [provider]  FLUZONE HIGH-DOSE QUADRIVALENT 0.7 ML SUSY Inject 0.7 mLs into the muscle once. 12/20/18   [provider]    Family History No family history on file.  Social History Social History   Tobacco Use  . Smoking status: Former Smoker    Types: Cigarettes    Start date: 07/14/1946    Quit date: 07/14/1983    Years since quitting: 35.4  . Smokeless tobacco: Never Used  . Tobacco comment: 21/2 PPD  Substance Use Topics  . Alcohol use: No  . Drug use: No     Allergies   Penicillins   Review of Systems Review of Systems  Neurological: Positive for dizziness.  All other systems reviewed and are negative.    Physical Exam Updated Vital Signs BP (!) 160/75   Pulse 69   Resp 16   Ht 5\' 3"  (1.6 m)   Wt 49.9 kg   SpO2  100%   BMI 19.49 kg/m   Physical Exam Vitals signs and nursing note reviewed.  Constitutional:      Comments: Slightly uncomfortable   HENT:     Head: Normocephalic.     Nose: Nose normal.     Mouth/Throat:     Mouth: Mucous membranes are moist.  Eyes:     Comments: Nystagmus to the left, no rotatory nystagmus   Neck:     Musculoskeletal: Normal range of motion.  Cardiovascular:     Rate and Rhythm: Normal rate and regular rhythm.     Pulses: Normal pulses.     Heart sounds: Normal heart sounds.  Pulmonary:     Effort: Pulmonary effort is normal.     Breath sounds: Normal breath sounds.  Abdominal:     General: Abdomen is flat.     Palpations: Abdomen is soft.  Musculoskeletal: Normal range of motion.   Skin:    General: Skin is warm.     Capillary Refill: Capillary refill takes less than 2 seconds.  Neurological:     General: No focal deficit present.     Comments: Nystagmus to the left. CN 2- 12 intact. Nl strength throughout. Nl finger to nose bilaterally. Wide based gait, slightly unsteady   Psychiatric:        Mood and Affect: Mood normal.      ED Treatments / Results  Labs (all labs ordered are listed, but only abnormal results are displayed) Labs Reviewed  BASIC METABOLIC PANEL - Abnormal; Notable for the following components:      Result Value   Glucose, Bld 125 (*)    BUN 24 (*)    GFR calc non Af Amer 54 (*)    All other components within normal limits  CBC  URINALYSIS, ROUTINE W REFLEX MICROSCOPIC    EKG EKG Interpretation  Date/Time:  Thursday December 23 2018 18:57:00 EDT Ventricular Rate:  76 PR Interval:    QRS Duration: 103 QT Interval:  379 QTC Calculation: 427 R Axis:   74 Text Interpretation:  Sinus rhythm Low voltage, precordial leads No significant change since last tracing Confirmed by Wandra Arthurs [phone]) on 12/23/2018 7:36:01 PM   Radiology No results found.  Procedures Procedures (including critical care time)  Medications Ordered in ED Medications  meclizine (ANTIVERT) tablet 25 mg (has no administration in time range)  sodium chloride 0.9 % bolus 1,000 mL (1,000 mLs Intravenous New Bag/Given 12/23/18 1858)     Initial Impression / Assessment and Plan / ED Course  I have reviewed the triage vital signs and the nursing notes.  Pertinent labs & imaging results that were available during my care of the patient were reviewed by me and considered in my medical decision making (see chart for details).       Henry Harvey is a 85 y.o. male here presenting with dizziness.  Patient does have some nystagmus to the left. Patient also has bilateral bur holes from the previous subdural .  Consider subdural hemorrhage versus subarachnoid versus  ischemic stroke.  Will give meclizine, check labs, get MRI brain.   10:09 PM Labs unremarkable. MRI brain showed resolution of subdural hematomas. Felt better after meclizine. Will dc home with meclizine prn.    Final Clinical Impressions(s) / ED Diagnoses   Final diagnoses:  None    ED Discharge Orders    None       Drenda Freeze, MD 12/23/18 2209

## 2018-12-23 NOTE — ED Notes (Signed)
Pt was verbalized discharge instructions. Pt had no further questions at this time. NAD. 

## 2018-12-29 ENCOUNTER — Other Ambulatory Visit: Payer: Self-pay | Admitting: Internal Medicine

## 2018-12-29 DIAGNOSIS — I10 Essential (primary) hypertension: Secondary | ICD-10-CM

## 2019-01-20 ENCOUNTER — Other Ambulatory Visit: Payer: Self-pay

## 2019-01-20 ENCOUNTER — Ambulatory Visit (INDEPENDENT_AMBULATORY_CARE_PROVIDER_SITE_OTHER): Payer: Medicare HMO | Admitting: Internal Medicine

## 2019-01-20 ENCOUNTER — Encounter: Payer: Self-pay | Admitting: Internal Medicine

## 2019-01-20 VITALS — Temp 98.0°F | Ht 63.0 in | Wt 111.6 lb

## 2019-01-20 DIAGNOSIS — R351 Nocturia: Secondary | ICD-10-CM

## 2019-01-20 DIAGNOSIS — N400 Enlarged prostate without lower urinary tract symptoms: Secondary | ICD-10-CM

## 2019-01-20 DIAGNOSIS — I10 Essential (primary) hypertension: Secondary | ICD-10-CM

## 2019-01-20 DIAGNOSIS — R350 Frequency of micturition: Secondary | ICD-10-CM | POA: Diagnosis not present

## 2019-01-20 DIAGNOSIS — N289 Disorder of kidney and ureter, unspecified: Secondary | ICD-10-CM

## 2019-01-20 NOTE — Progress Notes (Signed)
Subjective:     Patient ID: Henry Harvey , male    DOB: 04/11/1927 , 85 y.o.   MRN: [id number]   Chief Complaint  Patient presents with  . Follow-up    hypertension, also needs refill on prescription from ER visit    HPI Pt was in ER for dehydration is better but still feels slightly lightheaded when he gets up in the mornings, but not as severe as when he went to ER. He admits he still does not drink much fluids. Has continued taking his Norvac 2.5 mg qd.    Past Medical History:  Diagnosis Date  . Dementia ([place])   . Essential hypertension   . Hyperlipidemia      No family history on file.   Current Outpatient Medications:  .  amLODipine (NORVASC) 2.5 MG tablet, TAKE 1 TABLET BY MOUTH EVERY DAY, Disp: 90 tablet, Rfl: 0 .  aspirin 81 MG EC tablet, Take 81 mg by mouth daily., Disp: , Rfl: 3 .  atorvastatin (LIPITOR) 20 MG tablet, Take 20 mg by mouth daily., Disp: , Rfl:  .  dorzolamide-timolol (COSOPT) 22.3-6.8 MG/ML ophthalmic solution, Place 1 drop into the right eye 2 (two) times daily., Disp: , Rfl:  .  ENSURE (ENSURE), Take 237 mLs by mouth 2 (two) times daily between meals., Disp: , Rfl:  .  latanoprost (XALATAN) 0.005 % ophthalmic solution, Place 1 drop into both eyes at bedtime., Disp: , Rfl:  .  meclizine (ANTIVERT) 12.5 MG tablet, Take 1 tablet (12.5 mg total) by mouth 2 (two) times daily as needed for dizziness., Disp: 10 tablet, Rfl: 0 .  FLUZONE HIGH-DOSE QUADRIVALENT 0.7 ML SUSY, Inject 0.7 mLs into the muscle once., Disp: , Rfl:    Allergies  Allergen Reactions  . Penicillins Rash and Other (See Comments)    Has patient had a PCN reaction causing immediate rash, facial/tongue/throat swelling, SOB or lightheadedness with hypotension: Yes Has patient had a PCN reaction causing severe rash involving mucus membranes or skin necrosis: No Has patient had a PCN reaction that required hospitalization: No Has patient had a PCN reaction occurring within the last 10 years:  No If all of the above answers are "NO", then may proceed with Cephalosporin use.     Review of Systems  + for frequency, and thin urine stream for years, nocturia up to 2-3 times. Denies dysuria, CP, SOB, edema, abdominal pain, blood in stools, cloudy urine.  Today's Vitals   01/20/19 0900  Temp: 98 F (36.7 C)  TempSrc: Oral  Weight: 111 lb 9.6 oz (50.6 kg)  Height: 5\' 3"  (1.6 m)   Body mass index is 19.77 kg/m.   Objective:  Physical Exam  Weight is up 120  Constitutional: She is oriented to person, place, and time. She appears well-developed and well-nourished. No distress.  HENT:  Head: Normocephalic and atraumatic.  Right Ear: External ear normal.  Left Ear: External ear normal.  Nose: Nose normal.  Eyes: Conjunctivae are normal. Right eye exhibits no discharge. Left eye exhibits no discharge. No scleral icterus.  Neck: Neck supple. No thyromegaly present.  No carotid bruits bilaterally  Cardiovascular: Normal rate and regular rhythm.  No murmur heard. Pulmonary/Chest: Effort normal and breath sounds normal. No respiratory distress.  Musculoskeletal: Normal range of motion. She exhibits no edema.  Lymphadenopathy:    She has no cervical adenopathy.  Neurological: She is alert and oriented to person, place, and time.  Skin: Skin is warm and dry. Capillary refill  takes less than 2 seconds. No rash noted. She is not diaphoretic.  Psychiatric: She has a normal mood and affect. Her behavior is normal. Judgment and thought content normal.  Nursing note reviewed.    Assessment And Plan:     1. Abnormal renal function- chronic - BMP8+Anion Gap  2. Benign prostatic hyperplasia without lower urinary tract symptoms- possibly per symptom history. He wants to hold off from being sent to Urology.  3- HTN in good control and wonder if he may not need the Amlodipine any more.    I asked him to d/c the Amlodipine and I will see him back next week to check if the lightlessness  resolved.    Henry Macht RODRIGUEZ-SOUTHWORTH, PA-C    THE PATIENT IS ENCOURAGED TO PRACTICE SOCIAL DISTANCING DUE TO THE COVID-19 PANDEMIC.

## 2019-01-20 NOTE — Patient Instructions (Signed)
You seem to be getting dizzy still due to possibly not needing your blood pressure medication.  So stop taking the AMLODIPINE( NORVASC)  This week until I see you and we will see if your light headness resolves. Also push drinking at least 64 oz of fluids a day.

## 2019-01-21 LAB — BMP8+ANION GAP
Anion Gap: 12 mmol/L (ref 10.0–18.0)
BUN/Creatinine Ratio: 17 (ref 10–24)
BUN: 24 mg/dL (ref 10–36)
CO2: 24 mmol/L (ref 20–29)
Calcium: 9.5 mg/dL (ref 8.6–10.2)
Chloride: 107 mmol/L — ABNORMAL HIGH (ref 96–106)
Creatinine, Ser: 1.41 mg/dL — ABNORMAL HIGH (ref 0.76–1.27)
GFR calc Af Amer: 50 mL/min/{1.73_m2} — ABNORMAL LOW (ref >59)
GFR calc non Af Amer: 44 mL/min/{1.73_m2} — ABNORMAL LOW (ref >59)
Glucose: 160 mg/dL — ABNORMAL HIGH (ref 65–99)
Potassium: 5.1 mmol/L (ref 3.5–5.2)
Sodium: 143 mmol/L (ref 134–144)

## 2019-01-27 ENCOUNTER — Telehealth: Payer: Self-pay | Admitting: Internal Medicine

## 2019-01-27 ENCOUNTER — Other Ambulatory Visit: Payer: Self-pay

## 2019-01-27 ENCOUNTER — Ambulatory Visit (INDEPENDENT_AMBULATORY_CARE_PROVIDER_SITE_OTHER): Payer: Medicare HMO | Admitting: Internal Medicine

## 2019-01-27 ENCOUNTER — Encounter: Payer: Self-pay | Admitting: Internal Medicine

## 2019-01-27 VITALS — BP 116/70 | HR 84 | Temp 98.2°F | Ht 63.0 in | Wt 110.6 lb

## 2019-01-27 DIAGNOSIS — F0390 Unspecified dementia without behavioral disturbance: Secondary | ICD-10-CM | POA: Diagnosis not present

## 2019-01-27 DIAGNOSIS — I10 Essential (primary) hypertension: Secondary | ICD-10-CM | POA: Diagnosis not present

## 2019-01-27 DIAGNOSIS — R7309 Other abnormal glucose: Secondary | ICD-10-CM

## 2019-01-27 DIAGNOSIS — R350 Frequency of micturition: Secondary | ICD-10-CM | POA: Diagnosis not present

## 2019-01-27 LAB — HEMOGLOBIN A1C
Est. average glucose Bld gHb Est-mCnc: 128 mg/dL
Hgb A1c MFr Bld: 6.1 % — ABNORMAL HIGH (ref 4.8–5.6)

## 2019-01-27 NOTE — Patient Instructions (Addendum)
A score of 20 to 24 suggests mild dementia, 13 to 20 suggests moderate dementia, and less than 12 indicates severe dementia.  Your score was 10 when you had your medicare wellness visit this year.    You no longer need the Amlodipine for your blood pressure, so you may stay off this.

## 2019-01-27 NOTE — Progress Notes (Signed)
Subjective:     Patient ID: Henry Harvey , male    DOB: [birth year] , 85 y.o.   MRN: [id number]   Chief Complaint  Patient presents with  . Follow-up    b/p    HPI Pt comes in today to review his labs with me, FU dizziness due to hypotension and dehydration and also his daughter is here concerned he has Dementia. He has been forgetful, thought he placed his meds in his weekly med container and she checks to make sure is takes it, and has been taking it. Has not left the stove unattended, or water running.   Has not been dizzy any more since he d/c the Amlodipine.   Past Medical History:  Diagnosis Date  . Dementia ([facility])   . Essential hypertension   . Hyperlipidemia      FAm Hx- father CVA   Current Outpatient Medications:  .  aspirin 81 MG EC tablet, Take 81 mg by mouth daily., Disp: , Rfl: 3 .  atorvastatin (LIPITOR) 20 MG tablet, Take 20 mg by mouth daily., Disp: , Rfl:  .  dorzolamide-timolol (COSOPT) 22.3-6.8 MG/ML ophthalmic solution, Place 1 drop into the right eye 2 (two) times daily., Disp: , Rfl:  .  ENSURE (ENSURE), Take 237 mLs by mouth 2 (two) times daily between meals., Disp: , Rfl:  .  latanoprost (XALATAN) 0.005 % ophthalmic solution, Place 1 drop into both eyes at bedtime., Disp: , Rfl:  .  amLODipine (NORVASC) 2.5 MG tablet, TAKE 1 TABLET BY MOUTH EVERY DAY (Patient not taking: Reported on 01/27/2019), Disp: 90 tablet, Rfl: 0 .  FLUZONE HIGH-DOSE QUADRIVALENT 0.7 ML SUSY, Inject 0.7 mLs into the muscle once., Disp: , Rfl:  .  meclizine (ANTIVERT) 12.5 MG tablet, Take 1 tablet (12.5 mg total) by mouth 2 (two) times daily as needed for dizziness. (Patient not taking: Reported on 01/27/2019), Disp: 10 tablet, Rfl: 0   Allergies  Allergen Reactions  . Penicillins Rash and Other (See Comments)    Has patient had a PCN reaction causing immediate rash, facial/tongue/throat swelling, SOB or lightheadedness with hypotension: Yes Has patient had a PCN reaction causing severe  rash involving mucus membranes or skin necrosis: No Has patient had a PCN reaction that required hospitalization: No Has patient had a PCN reaction occurring within the last 10 years: No If all of the above answers are "NO", then may proceed with Cephalosporin use.     Review of Systems  Denies dizziness, polyphagia, polyuria. + for urinary frequency and nocturia, but does not want to see urology. Per his daughter has memory problems. Appetite is unchanged. Denies recent falls.  Today's Vitals   01/27/19 0910  BP: 116/70  Pulse: 84  Temp: 98.2 F (36.8 C)  TempSrc: Oral  Weight: 110 lb 9.6 oz (50.2 kg)  Height: 5\' 3"  (1.6 m)   Body mass index is 19.59 kg/m.   Objective:  Physical Exam   Constitutional: he is oriented to person, place, and time. he appears well-developed and well-nourished. No distress.  HENT:  Head: Normocephalic and atraumatic.  Right Ear: External ear normal.  Left Ear: External ear normal.  Nose: Nose normal.  Eyes: Conjunctivae are normal. Right eye exhibits no discharge. Left eye exhibits no discharge. No scleral icterus.  Neck: Neck supple.   Cardiovascular: Normal rate and regular rhythm.  No murmur heard. Pulmonary/Chest: Effort normal and breath sounds normal. No respiratory distress.  Musculoskeletal: Normal range of motion.  Neurological: he is alert  and oriented to person, place, and time.  Skin: Skin is warm and dry. Capillary refill takes less than 2 seconds. No rash noted. he is not diaphoretic.  Psychiatric: he has a normal mood and affect. His behavior is normal. Judgment and thought content normal.  Nursing note reviewed.     Assessment And Plan:     1. Essential hypertension - resolved. Advised to stay off Amlodipine.    2. Dementia without behavioral disturbance, unspecified dementia type ([place])   Declined seeing a neurologist. For now his daughter who is a Education officer, museum will continue attending to him and double check he drinks  plenty of fluids and his medications are taken correctly.   3. Abnormal glucose- new.  - Hemoglobin A1c 4- Urinary frequency- declined to see urologist.   Henry Sieloff RODRIGUEZ-SOUTHWORTH, PA-C    THE PATIENT IS ENCOURAGED TO PRACTICE SOCIAL DISTANCING DUE TO THE COVID-19 PANDEMIC.

## 2019-01-27 NOTE — Chronic Care Management (AMB) (Signed)
Chronic Care Management   Note  01/27/2019 Name: Henry Harvey MRN: [id number] DOB: 08/08/1926  Henry Harvey is a 85 y.o. year old male who is a primary care patient of [place], Sunday Spillers, [place]. I reached out to Dawna Part by phone today in response to a referral sent by Henry Harvey health plan.     Henry Harvey was given information about Chronic Care Management services today including:  1. CCM service includes personalized support from designated clinical staff supervised by his physician, including individualized plan of care and coordination with other care providers 2. 24/7 contact phone numbers for assistance for urgent and routine care needs. 3. Service will only be billed when office clinical staff spend 20 minutes or more in a month to coordinate care. 4. Only one practitioner may furnish and bill the service in a calendar month. 5. The patient may stop CCM services at any time (effective at the end of the month) by phone call to the office staff. 6. The patient will be responsible for cost sharing (co-pay) of up to 20% of the service fee (after annual deductible is met).  Patient agreed to services and verbal consent obtained.   Follow up plan: Telephone appointment with CCM team member scheduled for: 02/28/2019  [place]  ??[email or web address]   ??[phone]

## 2019-01-31 ENCOUNTER — Telehealth: Payer: Self-pay

## 2019-01-31 NOTE — Telephone Encounter (Signed)
Spoke with pt daughter per [facility] request, she stated she will be heading over to his house to speak with him about his results, and to give him [place] advice   Rodriguez-Southworth, Sunday Spillers, PA-C  Candiss Norse T, CMA        Please call his daughter and tell her that his DM test is a little worse, nl is 5.6 or less, was 5.9 3 months ago, and now is 6.1. Needs to help him avoid eating too much sweets.

## 2019-02-28 ENCOUNTER — Telehealth: Payer: Medicare HMO

## 2019-03-01 NOTE — Progress Notes (Signed)
This encounter was created in error - please disregard.

## 2019-03-11 ENCOUNTER — Telehealth: Payer: Self-pay | Admitting: Internal Medicine

## 2019-03-11 NOTE — Telephone Encounter (Signed)
I spoke with the patient, and he agreed to come in at 8:30 instead of 9:00 on 05/05/2019.

## 2019-05-05 ENCOUNTER — Ambulatory Visit (INDEPENDENT_AMBULATORY_CARE_PROVIDER_SITE_OTHER): Payer: Medicare Other | Admitting: Internal Medicine

## 2019-05-05 ENCOUNTER — Other Ambulatory Visit: Payer: Self-pay

## 2019-05-05 ENCOUNTER — Ambulatory Visit (INDEPENDENT_AMBULATORY_CARE_PROVIDER_SITE_OTHER): Payer: Medicare Other

## 2019-05-05 VITALS — BP 116/58 | HR 80 | Temp 98.5°F | Ht 61.8 in | Wt 107.4 lb

## 2019-05-05 DIAGNOSIS — R3121 Asymptomatic microscopic hematuria: Secondary | ICD-10-CM

## 2019-05-05 DIAGNOSIS — E782 Mixed hyperlipidemia: Secondary | ICD-10-CM

## 2019-05-05 DIAGNOSIS — Z0000 Encounter for general adult medical examination without abnormal findings: Secondary | ICD-10-CM

## 2019-05-05 DIAGNOSIS — I10 Essential (primary) hypertension: Secondary | ICD-10-CM

## 2019-05-05 DIAGNOSIS — R7303 Prediabetes: Secondary | ICD-10-CM

## 2019-05-05 LAB — POCT URINALYSIS DIPSTICK
Bilirubin, UA: NEGATIVE
Glucose, UA: NEGATIVE
Ketones, UA: NEGATIVE
Leukocytes, UA: NEGATIVE
Nitrite, UA: NEGATIVE
Protein, UA: POSITIVE — AB
Spec Grav, UA: >=1.03 — AB (ref 1.010–1.025)
Urobilinogen, UA: 0.2 E.U./dL
pH, UA: 5.5 (ref 5.0–8.0)

## 2019-05-05 LAB — POCT UA - MICROALBUMIN
Creatinine, POC: 300 mg/dL
Microalbumin Ur, POC: 80 mg/L

## 2019-05-05 NOTE — Progress Notes (Signed)
This visit occurred during the SARS-CoV-2 public health emergency.  Safety protocols were in place, including screening questions prior to the visit, additional usage of staff PPE, and extensive cleaning of exam room while observing appropriate contact time as indicated for disinfecting solutions.  Subjective:   Henry Harvey is a 86 y.o. male who presents for Medicare Annual/Subsequent preventive examination.  Review of Systems:  n/a Cardiac Risk Factors include: advanced age (>70men, >62 women);hypertension;male gender;sedentary lifestyle     Objective:    Vitals: BP (!) 116/58 (BP Location: Left Arm, Patient Position: Sitting, Cuff Size: Normal)   Pulse 80   Temp 98.5 F (36.9 C) (Oral)   Ht 5' 1.8" (1.57 m)   Wt 107 lb 6.4 oz (48.7 kg)   BMI 19.77 kg/m   Body mass index is 19.77 kg/m.  Advanced Directives 05/05/2019 12/23/2018 10/14/2018 05/29/2016 05/28/2016 04/05/2016 02/26/2016  Does Patient Have a Medical Advance Directive? No Yes Yes Yes No No No  Type of Advance Directive - Living will Healthcare Power of [place];Living will Living will - - -  Does patient want to make changes to medical advance directive? - - - No - Patient declined - - -  Copy of Healthcare Power of Attorney in Chart? - - No - copy requested - - - -  Would patient like information on creating a medical advance directive? - - - - No - Patient declined No - Patient declined No - patient declined information    Tobacco Social History   Tobacco Use  Smoking Status Former Smoker  . Types: Cigarettes  . Start date: 07/14/1946  . Quit date: 07/14/1983  . Years since quitting: 35.8  Smokeless Tobacco Never Used  Tobacco Comment   21/2 PPD     Counseling given: Not Answered Comment: 21/2 PPD   Clinical Intake:  Pre-visit preparation completed: Yes  Pain : No/denies pain     Nutritional Status: BMI of 19-24  Normal Nutritional Risks: None Diabetes: No  How often do you need to have someone help  you when you read instructions, pamphlets, or other written materials from your doctor or pharmacy?: 1 - Never What is the last grade level you completed in school?: 3 years college  Interpreter Needed?: No  Information entered by :: NAllen LPN  Past Medical History:  Diagnosis Date  . Dementia ([facility])   . Essential hypertension   . Hyperlipidemia    Past Surgical History:  Procedure Laterality Date  . BURR HOLE Bilateral 05/28/2016   Procedure: BURR HOLES FOR SUBDURAL HEMATOMA;  Surgeon: Donalee Citrin, MD;  Location: [facility] OR;  Service: Neurosurgery;  Laterality: Bilateral;  . BURR HOLE FOR SUBDURAL HEMATOMA Bilateral    Family History  Problem Relation Age of Onset  . CVA Father    Social History   Socioeconomic History  . Marital status: Married    Spouse name: Not on file  . Number of children: Not on file  . Years of education: Not on file  . Highest education level: Not on file  Occupational History  . Occupation: retired  Tobacco Use  . Smoking status: Former Smoker    Types: Cigarettes    Start date: 07/14/1946    Quit date: 07/14/1983    Years since quitting: 35.8  . Smokeless tobacco: Never Used  . Tobacco comment: 21/2 PPD  Substance and Sexual Activity  . Alcohol use: No  . Drug use: No  . Sexual activity: Not Currently  Other Topics Concern  .  Not on file  Social History Narrative  . Not on file   Social Determinants of Health   Financial Resource Strain: Low Risk   . Difficulty of Paying Living Expenses: Not hard at all  Food Insecurity: No Food Insecurity  . Worried About Programme researcher, broadcasting/film/video in the Last Year: Never true  . Ran Out of Food in the Last Year: Never true  Transportation Needs: No Transportation Needs  . Lack of Transportation (Medical): No  . Lack of Transportation (Non-Medical): No  Physical Activity: Inactive  . Days of Exercise per Week: 0 days  . Minutes of Exercise per Session: 0 min  Stress: No Stress Concern Present  . Feeling of  Stress : Not at all  Social Connections:   . Frequency of Communication with Friends and Family: Not on file  . Frequency of Social Gatherings with Friends and Family: Not on file  . Attends Religious Services: Not on file  . Active Member of Clubs or Organizations: Not on file  . Attends Banker Meetings: Not on file  . Marital Status: Not on file    Outpatient Encounter Medications as of 05/05/2019  Medication Sig  . aspirin 81 MG EC tablet Take 81 mg by mouth daily.  Marland Kitchen atorvastatin (LIPITOR) 20 MG tablet Take 20 mg by mouth daily.  . dorzolamide-timolol (COSOPT) 22.3-6.8 MG/ML ophthalmic solution Place 1 drop into the right eye 2 (two) times daily.  Marland Kitchen ENSURE (ENSURE) Take 237 mLs by mouth 2 (two) times daily between meals.  . latanoprost (XALATAN) 0.005 % ophthalmic solution Place 1 drop into both eyes at bedtime.  Marland Kitchen amLODipine (NORVASC) 2.5 MG tablet TAKE 1 TABLET BY MOUTH EVERY DAY (Patient not taking: Reported on 01/27/2019)  . FLUZONE HIGH-DOSE QUADRIVALENT 0.7 ML SUSY Inject 0.7 mLs into the muscle once.  . meclizine (ANTIVERT) 12.5 MG tablet Take 1 tablet (12.5 mg total) by mouth 2 (two) times daily as needed for dizziness. (Patient not taking: Reported on 01/27/2019)   No facility-administered encounter medications on file as of 05/05/2019.    Activities of Daily Living In your present state of health, do you have any difficulty performing the following activities: 05/05/2019 10/14/2018  Hearing? Y N  Comment noticed decreased hearing in both ears -  Vision? N N  Difficulty concentrating or making decisions? N N  Walking or climbing stairs? Y Y  Dressing or bathing? N N  Doing errands, shopping? N N  Preparing Food and eating ? N N  Using the Toilet? N N  In the past six months, have you accidently leaked urine? Y N  Do you have problems with loss of bowel control? N N  Managing your Medications? N N  Managing your Finances? N N  Housekeeping or managing your  Housekeeping? N N  Some recent data might be hidden    Patient Care Team: Rodriguez-Southworth, Viviana Simpler as PCP - General (Internal Medicine) Rinaldo Cloud, MD as Consulting Physician (Cardiology) Little, Karma Lew, RN as [facility] Care Management   Assessment:   This is a routine wellness examination for [place].  Exercise Activities and Dietary recommendations Current Exercise Habits: The patient does not participate in regular exercise at present  Goals    . Patient Stated     No goals    . patient stated     05/05/2019, no ogoals       Fall Risk Fall Risk  05/05/2019 01/27/2019 01/20/2019 10/14/2018 10/14/2018  Falls in  the past year? 0 0 0 0 0  Number falls in past yr: - - - - -  Injury with Fall? - - - - -  Risk for fall due to : Medication side effect - - Medication side effect -  Follow up Falls evaluation completed;Education provided;Falls prevention discussed - - Education provided;Falls prevention discussed -   Is the patient's home free of loose throw rugs in walkways, pet beds, electrical cords, etc?   yes      Grab bars in the bathroom? no      Handrails on the stairs?   no      Adequate lighting?   yes  Timed Get Up and Go Performed: n/a  Depression Screen PHQ 2/9 Scores 05/05/2019 10/14/2018 10/14/2018 07/14/2018  PHQ - 2 Score 1 0 0 0  PHQ- 9 Score 4 0 - -    Cognitive Function     6CIT Screen 05/05/2019 10/14/2018  What Year? 0 points 0 points  What month? 0 points 0 points  What time? 0 points 0 points  Count back from 20 0 points 0 points  Months in reverse 4 points 0 points  Repeat phrase 6 points 10 points  Total Score 10 10    Immunization History  Administered Date(s) Administered  . Influenza, High Dose Seasonal PF 12/20/2018    Qualifies for Shingles Vaccine? yes  Screening Tests Health Maintenance  Topic Date Due  . TETANUS/TDAP  10/14/2019 (Originally 01/04/1946)  . PNA vac Low Risk Adult (1 of 2 - PCV13) 10/14/2019  (Originally 01/03/1994)  . INFLUENZA VACCINE  Completed   Cancer Screenings: Lung: Low Dose CT Chest recommended if Age 41-80 years, 30 pack-year currently smoking OR have quit w/in 15years. Patient does not qualify. Colorectal: not required  Additional Screenings:  Hepatitis C Screening:n/a      Plan:    Patient does not have any goals set at this time.  I have personally reviewed and noted the following in the patient's chart:   . Medical and social history . Use of alcohol, tobacco or illicit drugs  . Current medications and supplements . Functional ability and status . Nutritional status . Physical activity . Advanced directives . List of other physicians . Hospitalizations, surgeries, and ER visits in previous 12 months . Vitals . Screenings to include cognitive, depression, and falls . Referrals and appointments  In addition, I have reviewed and discussed with patient certain preventive protocols, quality metrics, and best practice recommendations. A written personalized care plan for preventive services as well as general preventive health recommendations were provided to patient.     Kellie Simmering, LPN  12/03/3252

## 2019-05-05 NOTE — Patient Instructions (Signed)
Mr. Henry Harvey , Thank you for taking time to come for your Medicare Wellness Visit. I appreciate your ongoing commitment to your health goals. Please review the following plan we discussed and let me know if I can assist you in the future.   Screening recommendations/referrals: Colonoscopy: not required Recommended yearly ophthalmology/optometry visit for glaucoma screening and checkup Recommended yearly dental visit for hygiene and checkup  Vaccinations: Influenza vaccine: 11/2018 Pneumococcal vaccine: declines Tdap vaccine: declines Shingles vaccine: discussed    Advanced directives: Advance directive discussed with you today. Even though you declined this today please call our office should you change your mind and we can give you the proper paperwork for you to fill out.   Conditions/risks identified: HTN  Next appointment: 10/20/2019 at 9:00  Preventive Care 65 Years and Older, Male Preventive care refers to lifestyle choices and visits with your health care provider that can promote health and wellness. What does preventive care include?  A yearly physical exam. This is also called an annual well check.  Dental exams once or twice a year.  Routine eye exams. Ask your health care provider how often you should have your eyes checked.  Personal lifestyle choices, including:  Daily care of your teeth and gums.  Regular physical activity.  Eating a healthy diet.  Avoiding tobacco and drug use.  Limiting alcohol use.  Practicing safe sex.  Taking low doses of aspirin every day.  Taking vitamin and mineral supplements as recommended by your health care provider. What happens during an annual well check? The services and screenings done by your health care provider during your annual well check will depend on your age, overall health, lifestyle risk factors, and family history of disease. Counseling  Your health care provider may ask you questions about your:  Alcohol  use.  Tobacco use.  Drug use.  Emotional well-being.  Home and relationship well-being.  Sexual activity.  Eating habits.  History of falls.  Memory and ability to understand (cognition).  Work and work Astronomer. Screening  You may have the following tests or measurements:  Height, weight, and BMI.  Blood pressure.  Lipid and cholesterol levels. These may be checked every 5 years, or more frequently if you are over 93 years old.  Skin check.  Lung cancer screening. You may have this screening every year starting at age 10 if you have a 30-pack-year history of smoking and currently smoke or have quit within the past 15 years.  Fecal occult blood test (FOBT) of the stool. You may have this test every year starting at age 69.  Flexible sigmoidoscopy or colonoscopy. You may have a sigmoidoscopy every 5 years or a colonoscopy every 10 years starting at age 49.  Prostate cancer screening. Recommendations will vary depending on your family history and other risks.  Hepatitis C blood test.  Hepatitis B blood test.  Sexually transmitted disease (STD) testing.  Diabetes screening. This is done by checking your blood sugar (glucose) after you have not eaten for a while (fasting). You may have this done every 1-3 years.  Abdominal aortic aneurysm (AAA) screening. You may need this if you are a current or former smoker.  Osteoporosis. You may be screened starting at age 46 if you are at high risk. Talk with your health care provider about your test results, treatment options, and if necessary, the need for more tests. Vaccines  Your health care provider may recommend certain vaccines, such as:  Influenza vaccine. This is recommended every  year.  Tetanus, diphtheria, and acellular pertussis (Tdap, Td) vaccine. You may need a Td booster every 10 years.  Zoster vaccine. You may need this after age 42.  Pneumococcal 13-valent conjugate (PCV13) vaccine. One dose is  recommended after age 19.  Pneumococcal polysaccharide (PPSV23) vaccine. One dose is recommended after age 72. Talk to your health care provider about which screenings and vaccines you need and how often you need them. This information is not intended to replace advice given to you by your health care provider. Make sure you discuss any questions you have with your health care provider. Document Released: 05/11/2015 Document Revised: 01/02/2016 Document Reviewed: 02/13/2015 Elsevier Interactive Patient Education  2017 [employer] Prevention in the Home Falls can cause injuries. They can happen to people of all ages. There are many things you can do to make your home safe and to help prevent falls. What can I do on the outside of my home?  Regularly fix the edges of walkways and driveways and fix any cracks.  Remove anything that might make you trip as you walk through a door, such as a raised step or threshold.  Trim any bushes or trees on the path to your home.  Use bright outdoor lighting.  Clear any walking paths of anything that might make someone trip, such as rocks or tools.  Regularly check to see if handrails are loose or broken. Make sure that both sides of any steps have handrails.  Any raised decks and porches should have guardrails on the edges.  Have any leaves, snow, or ice cleared regularly.  Use sand or salt on walking paths during winter.  Clean up any spills in your garage right away. This includes oil or grease spills. What can I do in the bathroom?  Use night lights.  Install grab bars by the toilet and in the tub and shower. Do not use towel bars as grab bars.  Use non-skid mats or decals in the tub or shower.  If you need to sit down in the shower, use a plastic, non-slip stool.  Keep the floor dry. Clean up any water that spills on the floor as soon as it happens.  Remove soap buildup in the tub or shower regularly.  Attach bath mats  securely with double-sided non-slip rug tape.  Do not have throw rugs and other things on the floor that can make you trip. What can I do in the bedroom?  Use night lights.  Make sure that you have a light by your bed that is easy to reach.  Do not use any sheets or blankets that are too big for your bed. They should not hang down onto the floor.  Have a firm chair that has side arms. You can use this for support while you get dressed.  Do not have throw rugs and other things on the floor that can make you trip. What can I do in the kitchen?  Clean up any spills right away.  Avoid walking on wet floors.  Keep items that you use a lot in easy-to-reach places.  If you need to reach something above you, use a strong step stool that has a grab bar.  Keep electrical cords out of the way.  Do not use floor polish or wax that makes floors slippery. If you must use wax, use non-skid floor wax.  Do not have throw rugs and other things on the floor that can make you trip. What can  I do with my stairs?  Do not leave any items on the stairs.  Make sure that there are handrails on both sides of the stairs and use them. Fix handrails that are broken or loose. Make sure that handrails are as long as the stairways.  Check any carpeting to make sure that it is firmly attached to the stairs. Fix any carpet that is loose or worn.  Avoid having throw rugs at the top or bottom of the stairs. If you do have throw rugs, attach them to the floor with carpet tape.  Make sure that you have a light switch at the top of the stairs and the bottom of the stairs. If you do not have them, ask someone to add them for you. What else can I do to help prevent falls?  Wear shoes that:  Do not have high heels.  Have rubber bottoms.  Are comfortable and fit you well.  Are closed at the toe. Do not wear sandals.  If you use a stepladder:  Make sure that it is fully opened. Do not climb a closed  stepladder.  Make sure that both sides of the stepladder are locked into place.  Ask someone to hold it for you, if possible.  Clearly mark and make sure that you can see:  Any grab bars or handrails.  First and last steps.  Where the edge of each step is.  Use tools that help you move around (mobility aids) if they are needed. These include:  Canes.  Walkers.  Scooters.  Crutches.  Turn on the lights when you go into a dark area. Replace any light bulbs as soon as they burn out.  Set up your furniture so you have a clear path. Avoid moving your furniture around.  If any of your floors are uneven, fix them.  If there are any pets around you, be aware of where they are.  Review your medicines with your doctor. Some medicines can make you feel dizzy. This can increase your chance of falling. Ask your doctor what other things that you can do to help prevent falls. This information is not intended to replace advice given to you by your health care provider. Make sure you discuss any questions you have with your health care provider. Document Released: 02/08/2009 Document Revised: 09/20/2015 Document Reviewed: 05/19/2014 Elsevier Interactive Patient Education  2017 [employer].

## 2019-05-05 NOTE — Progress Notes (Signed)
This visit occurred during the SARS-CoV-2 public health emergency.  Safety protocols were in place, including screening questions prior to the visit, additional usage of staff PPE, and extensive cleaning of exam room while observing appropriate contact time as indicated for disinfecting solutions.  Subjective:     Patient ID: Henry Harvey , male    DOB: January 19, 1927 , 86 y.o.   MRN: [id number]   Chief Complaint  Patient presents with  . Hyperlipidemia    HPI Pt is here for hyperlipidemia FU. Per his daughter he has been eating well, has lost his wife 12/28 and has not seemed sad about it. When I asked him about it, he said he was fine, and expected her to go any time due to her health. He does not have any complaints today. She denied him having any falls or accidents as he lives alone.    Past Medical History:  Diagnosis Date  . Dementia ([place])   . Essential hypertension   . Hyperlipidemia      Family History  Problem Relation Age of Onset  . CVA Father      Current Outpatient Medications:  .  amLODipine (NORVASC) 2.5 MG tablet, TAKE 1 TABLET BY MOUTH EVERY DAY (Patient not taking: Reported on 01/27/2019), Disp: 90 tablet, Rfl: 0 .  aspirin 81 MG EC tablet, Take 81 mg by mouth daily., Disp: , Rfl: 3 .  atorvastatin (LIPITOR) 20 MG tablet, Take 20 mg by mouth daily., Disp: , Rfl:  .  dorzolamide-timolol (COSOPT) 22.3-6.8 MG/ML ophthalmic solution, Place 1 drop into the right eye 2 (two) times daily., Disp: , Rfl:  .  ENSURE (ENSURE), Take 237 mLs by mouth 2 (two) times daily between meals., Disp: , Rfl:  .  FLUZONE HIGH-DOSE QUADRIVALENT 0.7 ML SUSY, Inject 0.7 mLs into the muscle once., Disp: , Rfl:  .  latanoprost (XALATAN) 0.005 % ophthalmic solution, Place 1 drop into both eyes at bedtime., Disp: , Rfl:  .  meclizine (ANTIVERT) 12.5 MG tablet, Take 1 tablet (12.5 mg total) by mouth 2 (two) times daily as needed for dizziness. (Patient not taking: Reported on 01/27/2019), Disp: 10  tablet, Rfl: 0   Allergies  Allergen Reactions  . Penicillins Rash and Other (See Comments)    Has patient had a PCN reaction causing immediate rash, facial/tongue/throat swelling, SOB or lightheadedness with hypotension: Yes Has patient had a PCN reaction causing severe rash involving mucus membranes or skin necrosis: No Has patient had a PCN reaction that required hospitalization: No Has patient had a PCN reaction occurring within the last 10 years: No If all of the above answers are "NO", then may proceed with Cephalosporin use.     Review of Systems  Review of Systems  Constitutional: Negative for diaphoresis and unexpected weight change.  HENT: Negative for tinnitus.   Eyes: Negative for visual disturbance.  Respiratory: Negative for chest tightness and shortness of breath.   Cardiovascular: Negative for chest pain, palpitations and leg swelling.  Gastrointestinal: Negative for constipation, diarrhea and nausea.  Endocrine: Negative for polydipsia, polyphagia and polyuria.  Genitourinary: Negative for dysuria and frequency. Gets up 2-3 times at night to void.  Skin: Negative for rash and wound.  Neurological: Negative for dizziness, speech difficulty, weakness, numbness and headaches.  Today's Vitals   05/05/19 0904  BP: (!) 116/58  Pulse: 80  Temp: 98.5 F (36.9 C)  Weight: 107 lb 6.4 oz (48.7 kg)  Height: 5' 1.8" (1.57 m)   Body mass index  is 19.77 kg/m.   Objective:  Physical Exam   Constitutional: She is oriented to person, place, and time. She appears well-developed and well-nourished. No distress.  HENT:  Head: Normocephalic and atraumatic.  Right Ear: External ear normal.  Left Ear: External ear normal.  Nose: Nose normal.  Eyes: Conjunctivae are normal. Right eye exhibits no discharge. Left eye exhibits no discharge. No scleral icterus.  Neck: Neck supple. No thyromegaly present.  No carotid bruits bilaterally  Cardiovascular: Normal rate and regular  rhythm.  No murmur heard. Pulmonary/Chest: Effort normal and breath sounds normal. No respiratory distress.  Musculoskeletal: Normal range of motion. She exhibits no edema.  Lymphadenopathy:    She has no cervical adenopathy.  Neurological: She is alert and oriented to person, place, and time.  Skin: Skin is warm and dry. Capillary refill takes less than 2 seconds. No rash noted. She is not diaphoretic.  Psychiatric: She has a normal mood and affect. Her behavior is normal. Judgment and thought content normal.  Nursing note reviewed.   UA- shows SG > 1.30, trace blood and protein.  Assessment And Plan:     1. Mixed hyperlipidemia- chronic. May continue current medication - Lipid Profile - CMP14 + Anion Gap - CBC no Diff  2. Prediabetes- chronic. May continue DM diet.  - Hemoglobin A1c 3- Microscopic hematuria- asymptomatic. Urine sent out for microscopic UA.   Per his daughter who is at work today, she asked I leave a message for any new findings or concerns.   Rilee Wendling RODRIGUEZ-SOUTHWORTH, PA-C    THE PATIENT IS ENCOURAGED TO PRACTICE SOCIAL DISTANCING DUE TO THE COVID-19 PANDEMIC.

## 2019-05-06 LAB — LIPID PANEL
Chol/HDL Ratio: 4.4 ratio (ref 0.0–5.0)
Cholesterol, Total: 154 mg/dL (ref 100–199)
HDL: 35 mg/dL — ABNORMAL LOW (ref >39)
LDL Chol Calc (NIH): 101 mg/dL — ABNORMAL HIGH (ref 0–99)
Triglycerides: 93 mg/dL (ref 0–149)
VLDL Cholesterol Cal: 18 mg/dL (ref 5–40)

## 2019-05-06 LAB — CMP14 + ANION GAP
ALT: 16 IU/L (ref 0–44)
AST: 24 IU/L (ref 0–40)
Albumin/Globulin Ratio: 1.4 (ref 1.2–2.2)
Albumin: 3.8 g/dL (ref 3.5–4.6)
Alkaline Phosphatase: 98 IU/L (ref 39–117)
Anion Gap: 16 mmol/L (ref 10.0–18.0)
BUN/Creatinine Ratio: 14 (ref 10–24)
BUN: 22 mg/dL (ref 10–36)
Bilirubin Total: 0.4 mg/dL (ref 0.0–1.2)
CO2: 19 mmol/L — ABNORMAL LOW (ref 20–29)
Calcium: 8.9 mg/dL (ref 8.6–10.2)
Chloride: 104 mmol/L (ref 96–106)
Creatinine, Ser: 1.59 mg/dL — ABNORMAL HIGH (ref 0.76–1.27)
GFR calc Af Amer: 44 mL/min/{1.73_m2} — ABNORMAL LOW (ref >59)
GFR calc non Af Amer: 38 mL/min/{1.73_m2} — ABNORMAL LOW (ref >59)
Globulin, Total: 2.7 g/dL (ref 1.5–4.5)
Glucose: 112 mg/dL — ABNORMAL HIGH (ref 65–99)
Potassium: 4.9 mmol/L (ref 3.5–5.2)
Sodium: 139 mmol/L (ref 134–144)
Total Protein: 6.5 g/dL (ref 6.0–8.5)

## 2019-05-06 LAB — URINALYSIS, MICROSCOPIC ONLY
Bacteria, UA: NONE SEEN
Casts: NONE SEEN /lpf

## 2019-05-06 LAB — CBC
Hematocrit: 42.3 % (ref 37.5–51.0)
Hemoglobin: 14.1 g/dL (ref 13.0–17.7)
MCH: 29.2 pg (ref 26.6–33.0)
MCHC: 33.3 g/dL (ref 31.5–35.7)
MCV: 88 fL (ref 79–97)
Platelets: 227 10*3/uL (ref 150–450)
RBC: 4.83 x10E6/uL (ref 4.14–5.80)
RDW: 13.6 % (ref 11.6–15.4)
WBC: 3.7 10*3/uL (ref 3.4–10.8)

## 2019-05-06 LAB — HEMOGLOBIN A1C
Est. average glucose Bld gHb Est-mCnc: 131 mg/dL
Hgb A1c MFr Bld: 6.2 % — ABNORMAL HIGH (ref 4.8–5.6)

## 2019-05-12 ENCOUNTER — Other Ambulatory Visit: Payer: Self-pay | Admitting: Internal Medicine

## 2019-05-12 DIAGNOSIS — N289 Disorder of kidney and ureter, unspecified: Secondary | ICD-10-CM

## 2019-05-13 ENCOUNTER — Telehealth: Payer: Self-pay

## 2019-05-14 ENCOUNTER — Emergency Department (HOSPITAL_COMMUNITY): Payer: Medicare Other

## 2019-05-14 ENCOUNTER — Inpatient Hospital Stay (HOSPITAL_COMMUNITY)
Admission: EM | Admit: 2019-05-14 | Discharge: 2019-05-30 | DRG: 177 | Disposition: E | Payer: Medicare Other | Attending: Internal Medicine | Admitting: Internal Medicine

## 2019-05-14 ENCOUNTER — Encounter (HOSPITAL_COMMUNITY): Payer: Self-pay | Admitting: Emergency Medicine

## 2019-05-14 DIAGNOSIS — E1122 Type 2 diabetes mellitus with diabetic chronic kidney disease: Secondary | ICD-10-CM | POA: Diagnosis not present

## 2019-05-14 DIAGNOSIS — H401111 Primary open-angle glaucoma, right eye, mild stage: Secondary | ICD-10-CM | POA: Diagnosis not present

## 2019-05-14 DIAGNOSIS — N189 Chronic kidney disease, unspecified: Secondary | ICD-10-CM | POA: Diagnosis not present

## 2019-05-14 DIAGNOSIS — E1165 Type 2 diabetes mellitus with hyperglycemia: Secondary | ICD-10-CM | POA: Diagnosis not present

## 2019-05-14 DIAGNOSIS — J9601 Acute respiratory failure with hypoxia: Secondary | ICD-10-CM | POA: Diagnosis not present

## 2019-05-14 DIAGNOSIS — H409 Unspecified glaucoma: Secondary | ICD-10-CM | POA: Diagnosis present

## 2019-05-14 DIAGNOSIS — R778 Other specified abnormalities of plasma proteins: Secondary | ICD-10-CM | POA: Diagnosis present

## 2019-05-14 DIAGNOSIS — E875 Hyperkalemia: Secondary | ICD-10-CM | POA: Diagnosis present

## 2019-05-14 DIAGNOSIS — E440 Moderate protein-calorie malnutrition: Secondary | ICD-10-CM | POA: Diagnosis not present

## 2019-05-14 DIAGNOSIS — Z87891 Personal history of nicotine dependence: Secondary | ICD-10-CM | POA: Diagnosis not present

## 2019-05-14 DIAGNOSIS — Z515 Encounter for palliative care: Secondary | ICD-10-CM | POA: Diagnosis present

## 2019-05-14 DIAGNOSIS — E8729 Other acidosis: Secondary | ICD-10-CM | POA: Diagnosis present

## 2019-05-14 DIAGNOSIS — F0390 Unspecified dementia without behavioral disturbance: Secondary | ICD-10-CM | POA: Diagnosis present

## 2019-05-14 DIAGNOSIS — N179 Acute kidney failure, unspecified: Secondary | ICD-10-CM | POA: Diagnosis not present

## 2019-05-14 DIAGNOSIS — R630 Anorexia: Secondary | ICD-10-CM

## 2019-05-14 DIAGNOSIS — N19 Unspecified kidney failure: Secondary | ICD-10-CM | POA: Diagnosis not present

## 2019-05-14 DIAGNOSIS — N1831 Chronic kidney disease, stage 3a: Secondary | ICD-10-CM | POA: Diagnosis present

## 2019-05-14 DIAGNOSIS — E7800 Pure hypercholesterolemia, unspecified: Secondary | ICD-10-CM | POA: Diagnosis not present

## 2019-05-14 DIAGNOSIS — E870 Hyperosmolality and hypernatremia: Secondary | ICD-10-CM | POA: Diagnosis present

## 2019-05-14 DIAGNOSIS — J1282 Pneumonia due to coronavirus disease 2019: Secondary | ICD-10-CM | POA: Diagnosis present

## 2019-05-14 DIAGNOSIS — E860 Dehydration: Secondary | ICD-10-CM | POA: Diagnosis present

## 2019-05-14 DIAGNOSIS — I129 Hypertensive chronic kidney disease with stage 1 through stage 4 chronic kidney disease, or unspecified chronic kidney disease: Secondary | ICD-10-CM | POA: Diagnosis not present

## 2019-05-14 DIAGNOSIS — E785 Hyperlipidemia, unspecified: Secondary | ICD-10-CM | POA: Diagnosis not present

## 2019-05-14 DIAGNOSIS — E872 Acidosis: Secondary | ICD-10-CM | POA: Diagnosis not present

## 2019-05-14 DIAGNOSIS — R4182 Altered mental status, unspecified: Secondary | ICD-10-CM | POA: Insufficient documentation

## 2019-05-14 DIAGNOSIS — R000 Tachycardia, unspecified: Secondary | ICD-10-CM | POA: Diagnosis not present

## 2019-05-14 DIAGNOSIS — R739 Hyperglycemia, unspecified: Secondary | ICD-10-CM | POA: Diagnosis not present

## 2019-05-14 DIAGNOSIS — R0902 Hypoxemia: Secondary | ICD-10-CM | POA: Diagnosis not present

## 2019-05-14 DIAGNOSIS — G9341 Metabolic encephalopathy: Secondary | ICD-10-CM | POA: Diagnosis present

## 2019-05-14 DIAGNOSIS — Z7189 Other specified counseling: Secondary | ICD-10-CM | POA: Diagnosis not present

## 2019-05-14 DIAGNOSIS — Z79899 Other long term (current) drug therapy: Secondary | ICD-10-CM

## 2019-05-14 DIAGNOSIS — U071 COVID-19: Secondary | ICD-10-CM | POA: Diagnosis not present

## 2019-05-14 DIAGNOSIS — R4701 Aphasia: Secondary | ICD-10-CM | POA: Diagnosis present

## 2019-05-14 DIAGNOSIS — Z66 Do not resuscitate: Secondary | ICD-10-CM | POA: Diagnosis present

## 2019-05-14 DIAGNOSIS — R627 Adult failure to thrive: Secondary | ICD-10-CM | POA: Diagnosis not present

## 2019-05-14 DIAGNOSIS — I10 Essential (primary) hypertension: Secondary | ICD-10-CM | POA: Diagnosis not present

## 2019-05-14 DIAGNOSIS — R1310 Dysphagia, unspecified: Secondary | ICD-10-CM | POA: Diagnosis not present

## 2019-05-14 DIAGNOSIS — Z880 Allergy status to penicillin: Secondary | ICD-10-CM

## 2019-05-14 DIAGNOSIS — Z681 Body mass index (BMI) 19 or less, adult: Secondary | ICD-10-CM

## 2019-05-14 DIAGNOSIS — R29818 Other symptoms and signs involving the nervous system: Secondary | ICD-10-CM | POA: Diagnosis not present

## 2019-05-14 LAB — POCT I-STAT EG7
Acid-base deficit: 5 mmol/L — ABNORMAL HIGH (ref 0.0–2.0)
Bicarbonate: 18.9 mmol/L — ABNORMAL LOW (ref 20.0–28.0)
Calcium, Ion: 1.06 mmol/L — ABNORMAL LOW (ref 1.15–1.40)
HCT: 54 % — ABNORMAL HIGH (ref 39.0–52.0)
Hemoglobin: 18.4 g/dL — ABNORMAL HIGH (ref 13.0–17.0)
O2 Saturation: 55 %
Potassium: 5.3 mmol/L — ABNORMAL HIGH (ref 3.5–5.1)
Sodium: 152 mmol/L — ABNORMAL HIGH (ref 135–145)
TCO2: 20 mmol/L — ABNORMAL LOW (ref 22–32)
pCO2, Ven: 32.9 mmHg — ABNORMAL LOW (ref 44.0–60.0)
pH, Ven: 7.367 (ref 7.250–7.430)
pO2, Ven: 29 mmHg — CL (ref 32.0–45.0)

## 2019-05-14 LAB — CBC WITH DIFFERENTIAL/PLATELET
Abs Immature Granulocytes: 0.09 10*3/uL — ABNORMAL HIGH (ref 0.00–0.07)
Basophils Absolute: 0 10*3/uL (ref 0.0–0.1)
Basophils Relative: 0 %
Eosinophils Absolute: 0 10*3/uL (ref 0.0–0.5)
Eosinophils Relative: 0 %
HCT: 56.2 % — ABNORMAL HIGH (ref 39.0–52.0)
Hemoglobin: 17.8 g/dL — ABNORMAL HIGH (ref 13.0–17.0)
Immature Granulocytes: 1 %
Lymphocytes Relative: 6 %
Lymphs Abs: 0.5 10*3/uL — ABNORMAL LOW (ref 0.7–4.0)
MCH: 28.8 pg (ref 26.0–34.0)
MCHC: 31.7 g/dL (ref 30.0–36.0)
MCV: 90.9 fL (ref 80.0–100.0)
Monocytes Absolute: 0.7 10*3/uL (ref 0.1–1.0)
Monocytes Relative: 8 %
Neutro Abs: 7.4 10*3/uL (ref 1.7–7.7)
Neutrophils Relative %: 85 %
Platelets: 305 10*3/uL (ref 150–400)
RBC: 6.18 MIL/uL — ABNORMAL HIGH (ref 4.22–5.81)
RDW: 14.7 % (ref 11.5–15.5)
WBC: 8.7 10*3/uL (ref 4.0–10.5)
nRBC: 0 % (ref 0.0–0.2)

## 2019-05-14 LAB — URINALYSIS, COMPLETE (UACMP) WITH MICROSCOPIC
Bacteria, UA: NONE SEEN
Bilirubin Urine: NEGATIVE
Glucose, UA: NEGATIVE mg/dL
Ketones, ur: NEGATIVE mg/dL
Leukocytes,Ua: NEGATIVE
Nitrite: NEGATIVE
Protein, ur: 30 mg/dL — AB
Specific Gravity, Urine: 1.018 (ref 1.005–1.030)
pH: 5 (ref 5.0–8.0)

## 2019-05-14 LAB — COMPREHENSIVE METABOLIC PANEL
ALT: 23 U/L (ref 0–44)
AST: 30 U/L (ref 15–41)
Albumin: 3.1 g/dL — ABNORMAL LOW (ref 3.5–5.0)
Alkaline Phosphatase: 71 U/L (ref 38–126)
Anion gap: 22 — ABNORMAL HIGH (ref 5–15)
BUN: 117 mg/dL — ABNORMAL HIGH (ref 8–23)
CO2: 17 mmol/L — ABNORMAL LOW (ref 22–32)
Calcium: 9.5 mg/dL (ref 8.9–10.3)
Chloride: 112 mmol/L — ABNORMAL HIGH (ref 98–111)
Creatinine, Ser: 3.93 mg/dL — ABNORMAL HIGH (ref 0.61–1.24)
GFR calc Af Amer: 15 mL/min — ABNORMAL LOW (ref >60)
GFR calc non Af Amer: 13 mL/min — ABNORMAL LOW (ref >60)
Glucose, Bld: 144 mg/dL — ABNORMAL HIGH (ref 70–99)
Potassium: 5.4 mmol/L — ABNORMAL HIGH (ref 3.5–5.1)
Sodium: 151 mmol/L — ABNORMAL HIGH (ref 135–145)
Total Bilirubin: 1.3 mg/dL — ABNORMAL HIGH (ref 0.3–1.2)
Total Protein: 8.4 g/dL — ABNORMAL HIGH (ref 6.5–8.1)

## 2019-05-14 LAB — CBG MONITORING, ED: Glucose-Capillary: 118 mg/dL — ABNORMAL HIGH (ref 70–99)

## 2019-05-14 LAB — LACTIC ACID, PLASMA: Lactic Acid, Venous: 3.4 mmol/L (ref 0.5–1.9)

## 2019-05-14 LAB — CK: Total CK: 225 U/L (ref 49–397)

## 2019-05-14 LAB — TROPONIN I (HIGH SENSITIVITY)
Troponin I (High Sensitivity): 39 ng/L — ABNORMAL HIGH (ref <18)
Troponin I (High Sensitivity): 37 ng/L — ABNORMAL HIGH (ref <18)

## 2019-05-14 MED ORDER — ATORVASTATIN CALCIUM 10 MG PO TABS
20.0000 mg | ORAL_TABLET | Freq: Every day | ORAL | Status: DC
  Administered 2019-05-18 – 2019-05-21 (×3): 20 mg via ORAL
  Filled 2019-05-14 (×5): qty 2

## 2019-05-14 MED ORDER — DORZOLAMIDE HCL-TIMOLOL MAL 2-0.5 % OP SOLN
1.0000 [drp] | Freq: Two times a day (BID) | OPHTHALMIC | Status: DC
  Administered 2019-05-14 – 2019-05-21 (×12): 1 [drp] via OPHTHALMIC
  Filled 2019-05-14: qty 10

## 2019-05-14 MED ORDER — SODIUM CHLORIDE 0.9 % IV BOLUS
1000.0000 mL | Freq: Once | INTRAVENOUS | Status: AC
  Administered 2019-05-14: 20:00:00 1000 mL via INTRAVENOUS

## 2019-05-14 MED ORDER — SODIUM CHLORIDE 0.9 % IV SOLN: INTRAVENOUS | Status: DC

## 2019-05-14 MED ORDER — SODIUM CHLORIDE 0.9 % IV BOLUS
1000.0000 mL | Freq: Once | INTRAVENOUS | Status: AC
  Administered 2019-05-14: 17:00:00 1000 mL via INTRAVENOUS

## 2019-05-14 NOTE — ED Triage Notes (Signed)
Pt here from home with c/o aloc and unable to talk , according to family greater than 24 hrs , pt is alerta nd able to nod head but will not talk

## 2019-05-14 NOTE — Progress Notes (Signed)
New order received from MD.

## 2019-05-14 NOTE — ED Provider Notes (Signed)
[facility] EMERGENCY DEPARTMENT Provider Note   CSN: [id number] Arrival date & time: 05/25/2019  1436     History No chief complaint on file.   Henry Harvey is a 86 y.o. male.  HPI Altered middle status.  Patient is unable to talk but can shake his head yes and no. Patient is able to shake his head yes and no which was used to obtain a rudimentary history.  Patient had no symptoms yesterday or last night is feeling well with no chest pain shortness of breath headache fatigue fevers.  Woke up this morning and was unable to get out of bed or talk.  He denies any symptoms currently other than his inability to talk.  Denies any pain.  His daughter enters room he does whisper "hi" but is otherwise non-verbal.   Discussed with daughter Daughter states that her father is normally alert oriented and active.  Able to conduct all activities of daily living and take after himself.  His wife who he lived with for many years died last week and was buried Aug 14, 2022.  Patient does have a history of subdural hematoma which resolved with bur hole this was done in 2018.  He is on blood thinners no known trauma.  Satted well and healthy and talking normally without aphasia or difficulty with word finding yesterday evening at 6 PM per daughter.  He has no neurologic deficits from his subdural hematoma.   Discussed with Brenda-friend of patient Steward Drone states that she found patient this morning in bed when she went to check on him.  He was nonverbal but able to shake his head to communicate yes or no.     Past Medical History:  Diagnosis Date  . Dementia (HCC)   . Essential hypertension   . Hyperlipidemia     Patient Active Problem List   Diagnosis Date Noted  . AKI (acute kidney injury) (HCC) 05/25/2019  . Glaucoma, right eye   . Hypertension   . Hyperlipidemia   . Syncope   . Dementia without behavioral disturbance (HCC)   . Hyperglycemia   . SDH (subdural hematoma) (HCC)  05/28/2016  . Subdural hematoma (HCC) 05/28/2016    Past Surgical History:  Procedure Laterality Date  . BURR HOLE Bilateral 05/28/2016   Procedure: BURR HOLES FOR SUBDURAL HEMATOMA;  Surgeon: Donalee Citrin, MD;  Location: [facility] OR;  Service: Neurosurgery;  Laterality: Bilateral;  . BURR HOLE FOR SUBDURAL HEMATOMA Bilateral        Family History  Problem Relation Age of Onset  . CVA Father     Social History   Tobacco Use  . Smoking status: Former Smoker    Types: Cigarettes    Start date: 07/14/1946    Quit date: 07/14/1983    Years since quitting: 35.8  . Smokeless tobacco: Never Used  . Tobacco comment: 21/2 PPD  Substance Use Topics  . Alcohol use: No  . Drug use: No    Home Medications Prior to Admission medications   Medication Sig Start Date End Date Taking? Authorizing Provider  amLODipine (NORVASC) 2.5 MG tablet TAKE 1 TABLET BY MOUTH EVERY DAY Patient not taking: Reported on 01/27/2019 12/29/18   Rodriguez-Southworth, Nettie Elm, PA-C  atorvastatin (LIPITOR) 20 MG tablet Take 20 mg by mouth daily. 12/07/18   [provider]  dorzolamide-timolol (COSOPT) 22.3-6.8 MG/ML ophthalmic solution Place 1 drop into the right eye 2 (two) times daily.    [provider]  meclizine (ANTIVERT) 12.5 MG tablet Take  1 tablet (12.5 mg total) by mouth 2 (two) times daily as needed for dizziness. Patient not taking: Reported on 01/27/2019 12/23/18   Charlynne Pander, MD    Allergies    Penicillins  Review of Systems   Review of Systems  Constitutional: Negative for fever.    Physical Exam Updated Vital Signs BP (!) 126/109   Pulse 73   Temp (!) 97.4 F (36.3 C) (Axillary)   Resp 20   SpO2 94%   Physical Exam  ED Results / Procedures / Treatments   Labs (all labs ordered are listed, but only abnormal results are displayed) Labs Reviewed  COMPREHENSIVE METABOLIC PANEL - Abnormal; Notable for the following components:      Result Value   Sodium 151 (*)     Potassium 5.4 (*)    Chloride 112 (*)    CO2 17 (*)    Glucose, Bld 144 (*)    BUN 117 (*)    Creatinine, Ser 3.93 (*)    Total Protein 8.4 (*)    Albumin 3.1 (*)    Total Bilirubin 1.3 (*)    GFR calc non Af Amer 13 (*)    GFR calc Af Amer 15 (*)    Anion gap 22 (*)    All other components within normal limits  CBC WITH DIFFERENTIAL/PLATELET - Abnormal; Notable for the following components:   RBC 6.18 (*)    Hemoglobin 17.8 (*)    HCT 56.2 (*)    Lymphs Abs 0.5 (*)    Abs Immature Granulocytes 0.09 (*)    All other components within normal limits  CBG MONITORING, ED - Abnormal; Notable for the following components:   Glucose-Capillary 118 (*)    All other components within normal limits  POCT I-STAT EG7 - Abnormal; Notable for the following components:   pCO2, Ven 32.9 (*)    pO2, Ven 29.0 (*)    Bicarbonate 18.9 (*)    TCO2 20 (*)    Acid-base deficit 5.0 (*)    Sodium 152 (*)    Potassium 5.3 (*)    Calcium, Ion 1.06 (*)    HCT 54.0 (*)    Hemoglobin 18.4 (*)    All other components within normal limits  TROPONIN I (HIGH SENSITIVITY) - Abnormal; Notable for the following components:   Troponin I (High Sensitivity) 39 (*)    All other components within normal limits  SARS CORONAVIRUS 2 (TAT 6-24 HRS)  CK  URINALYSIS, COMPLETE (UACMP) WITH MICROSCOPIC  LACTIC ACID, PLASMA  LACTIC ACID, PLASMA  I-STAT VENOUS BLOOD GAS, ED  TROPONIN I (HIGH SENSITIVITY)    EKG EKG Interpretation  Date/Time:  Saturday May 14 2019 15:13:03 EST Ventricular Rate:  115 PR Interval:    QRS Duration: 103 QT Interval:  324 QTC Calculation: 449 R Axis:   88 Text Interpretation: Sinus tachycardia Multiple ventricular premature complexes Right atrial enlargement Borderline right axis deviation When comapred to prior, faster rate. No STEMI Confirmed by Theda Belfast ([id number]) on 04/29/2019 3:21:48 PM   Radiology CT HEAD WO CONTRAST  Result Date: 05/04/2019 CLINICAL DATA:   86 year old male with focal neurologic deficit. EXAM: CT HEAD WITHOUT CONTRAST TECHNIQUE: Contiguous axial images were obtained from the base of the skull through the vertex without intravenous contrast. COMPARISON:  Head CT dated 06/23/2016. FINDINGS: Brain: There is moderate age-related atrophy and chronic microvascular ischemic changes. There is no acute intracranial hemorrhage. Mass effect or midline shift. No extra-axial fluid collection. Vascular: No hyperdense  vessel or unexpected calcification. Skull: Or no acute calvarial pathology. Bilateral there folds noted. Sinuses/Orbits: There is chronic opacification of the left sphenoid sinus and partial opacification of the left maxillary sinus. The remainder of the visualized paranasal sinuses and mastoid air cells are clear. Other: None IMPRESSION: 1. No acute intracranial hemorrhage. 2. Age-related atrophy and chronic microvascular ischemic changes. Electronically Signed   By: Anner Crete M.D.   On: 05/26/2019 16:33   DG Chest Portable 1 View  Result Date: 05/02/2019 CLINICAL DATA:  Hypoxia. EXAM: PORTABLE CHEST 1 VIEW COMPARISON:  May 28, 2016. FINDINGS: The heart size and mediastinal contours are within normal limits. Both lungs are clear. No pneumothorax or pleural effusion is noted. Atherosclerosis of thoracic aorta is noted. The visualized skeletal structures are unremarkable. IMPRESSION: No active disease. Aortic Atherosclerosis (ICD10-I70.0). Electronically Signed   By: Marijo Conception M.D.   On: 05/05/2019 16:30    Procedures Procedures (including critical care time)  Medications Ordered in ED Medications  sodium chloride 0.9 % bolus 1,000 mL (has no administration in time range)  sodium chloride 0.9 % bolus 1,000 mL (1,000 mLs Intravenous New Bag/Given 05/10/2019 1716)    ED Course  I have reviewed the triage vital signs and the nursing notes.  Pertinent labs & imaging results that were available during my care of the patient  were reviewed by me and considered in my medical decision making (see chart for details).  Patient is 86 year old male presenting with lack of speech LSN 6pm yesterday. Daughter at bedside states his baseline is able to conduct all ADLs and no altered mental status and able to speak fluently.  Patient is neuro intact on my exam and follows commands well and shakes his head yes and no to questions asked.  Per EMS patient was difficult to get good SPO2 waveform on however he states he is not short of breath and is SPO2 is 94 to 96% in ED.  Most likely DDx includes intracranial hemorrhage as patient has history of subdural hematoma, electrolyte abnormality, stroke, metabolic encephalopathy.  The differential diagnosis for AMS is extensive and includes, but is not limited to: drug overdose - opioids, alcohol, sedatives, antipsychotics, drug withdrawal, others; Metabolic: hypoxia, hypoglycemia, hyperglycemia, hypercalcemia, hypernatremia, hyponatremia, uremia, hepatic encephalopathy, hypothyroidism, hyperthyroidism, vitamin B12 or thiamine deficiency, carbon monoxide poisoning, Wilson's disease, Lactic acidosis, DKA/HHOS; Infectious: meningitis, encephalitis, bacteremia/sepsis, urinary tract infection, pneumonia, neurosyphilis; Structural: Space-occupying lesion, (brain tumor, subdural hematoma, hydrocephalus,); Vascular: stroke, subarachnoid hemorrhage, coronary ischemia, hypertensive encephalopathy, CNS vasculitis, thrombotic thrombocytopenic purpura, disseminated intravascular coagulation, hyperviscosity; Psychiatric: Schizophrenia, depression; Other: Seizure, hypothermia, heat stroke, dementia  Clinical Course as of May 13 2000  Sat May 14, 2019  1645 MRI negative for intracranial hemorrhage, subdural/epidural/acute abnormality   [WF]  1645 Unremarkable  DG Chest Portable 1 View [WF]  1646 Nonischemic EKG with tachycardia.  Normal QT.  EKG 12-Lead [WF]  1647 No leukocytosis or anemia.  Hemoglobin  mildly elevated likely due to dehydration.  CBC WITH DIFFERENTIAL(!) [WF]  1648 CMP remarkable for elevated sodium, potassium, chloride.  Significant elevation in creatinine and BUN.  Consistent with prerenal AKI BUN is significantly elevated.  Anion gap elevated at 22  Comprehensive metabolic panel(!) [WF]    Clinical Course User Index [WF] Tedd Sias, PA   Mild hypoxia on VBG not critical.  Unlikely to be contributory to symptoms today.  CBG within normal limits.  Initial troponin elevated mildly at 39 no comparison however suspect that this is due to  renal insufficiency as patient has marked AKI.  Numerous electrolyte abnormalities elevated anion gap and bilirubin with low albumin.  Suspect that these are all due to patient's significantly changed kidney function.  9 days ago patient had much Lower creatinine BUN.  Discussed case with attending physician. I discussed this case with my attending physician who cosigned this note including patient's presenting symptoms, physical exam, and planned diagnostics and interventions. Attending physician stated agreement with plan or made changes to plan which were implemented. Attending physician assessed patient at bedside.  Patient given 2 L normal saline and consulted hospitalist for admission.  MRI ordered for stroke rule out no code stroke called to time frame.    MDM Rules/Calculators/A&P                      Discussed with Dr. Marice Potter of [facility] who will admit patient. MRI pending at this time.   Final Clinical Impression(s) / ED Diagnoses Final diagnoses:  Uremia  AKI (acute kidney injury) (HCC)  Altered mental status, unspecified altered mental status type    Rx / DC Orders ED Discharge Orders    None       Gailen Shelter, [place] 05/15/2019 2003    Tegeler, Canary Brim, MD 05-15-2019 2116

## 2019-05-14 NOTE — Progress Notes (Signed)
Paged on call MD for lactic acid of 3.4. Waiting for reply.

## 2019-05-14 NOTE — ED Notes (Signed)
Dr, Tegeler made aware of EG7 lab results. ED-Lab

## 2019-05-14 NOTE — H&P (Signed)
History and Physical        Hospital Admission Note Date: 09-Jun-2019  Patient name: Henry Harvey Medical record number: [id number] Date of birth: Sep 07, 1926 Age: 86 y.o. Gender: male  PCP: Dorothyann Peng, MD    Patient coming from: Home via EMS   I have reviewed all records in the [facility].    Chief Complaint:  Unable to speak   HPI: Henry Harvey is 86 y.o. male with PMH of hypertension, HLD and subdural hematoma a few years ago after a fall. History from patient is limited due to lack of speech. He follows commands and will shake his head yes or no. Verbally said "no" once during history; otherwise non-verbal. Patient denies pain. Does report he has not been eating or drinking much this week.   Daughter of patient is at bedside and provided more history. Father is normally alert and conversant. He performs all of his ADLs. Lived alone with his wife who passed away last week and was buried on Monday. No known trauma recently. Daughter saw him yesterday evening and he was speaking normally. Apparently, patients friend checked on him this morning and he was then found to be nonverbal but communicating by nodding or shaking his head.   Daughter denies any recent cold or viral like symptoms. He has not mentioned any chest pain or SOB. Patient himself denies difficulty with urination and denies fevers, nausea, vomiting, abdominal pain.    ED work-up/course:  Patient is 86 year old male presenting with lack of speech LSN 6pm yesterday. Daughter at bedside states his baseline is able to conduct all ADLs and no altered mental status and able to speak fluently.  Patient is neuro intact on my exam and follows commands well and shakes his head yes and no to questions asked.  Per EMS patient was difficult to get good SPO2 waveform on however he states he is not short of breath and is SPO2 is 94 to 96% in ED.  Mild hypoxia on VBG  not critical.  Unlikely to be contributory to symptoms today.  CBG within normal limits.  Initial troponin elevated mildly at 39 no comparison however suspect that this is due to renal insufficiency as patient has marked AKI.  Numerous electrolyte abnormalities elevated anion gap and bilirubin with low albumin.  Suspect that these are all due to patient's significantly changed kidney function.  9 days ago patient had much Lower creatinine BUN.   Patient given 2 L normal saline and consulted hospitalist for admission.  MRI ordered for stroke rule out no code stroke called to time frame.   Review of Systems: Positives marked in 'bold' Limited due to patient's status. Per HPI.   Past Medical History: Past Medical History:  Diagnosis Date  . Dementia (HCC)   . Essential hypertension   . Hyperlipidemia     Past Surgical History:  Procedure Laterality Date  . BURR HOLE Bilateral 05/28/2016   Procedure: BURR HOLES FOR SUBDURAL HEMATOMA;  Surgeon: Donalee Citrin, MD;  Location: [facility] OR;  Service: Neurosurgery;  Laterality: Bilateral;  . BURR HOLE FOR SUBDURAL HEMATOMA Bilateral     Medications: Prior to Admission medications   Medication Sig  Start Date End Date Taking? Authorizing Provider  amLODipine (NORVASC) 2.5 MG tablet TAKE 1 TABLET BY MOUTH EVERY DAY Patient not taking: Reported on 01/27/2019 12/29/18   Rodriguez-Southworth, Nettie Elm, PA-C  atorvastatin (LIPITOR) 20 MG tablet Take 20 mg by mouth daily. 12/07/18   [provider]  dorzolamide-timolol (COSOPT) 22.3-6.8 MG/ML ophthalmic solution Place 1 drop into the right eye 2 (two) times daily.    [provider]  meclizine (ANTIVERT) 12.5 MG tablet Take 1 tablet (12.5 mg total) by mouth 2 (two) times daily as needed for dizziness. Patient not taking: Reported on 01/27/2019 12/23/18   Charlynne Pander, MD    Allergies:   Allergies  Allergen Reactions  . Penicillins Rash and Other (See Comments)    Has patient had a PCN  reaction causing immediate rash, facial/tongue/throat swelling, SOB or lightheadedness with hypotension: Yes Has patient had a PCN reaction causing severe rash involving mucus membranes or skin necrosis: No Has patient had a PCN reaction that required hospitalization: No Has patient had a PCN reaction occurring within the last 10 years: No If all of the above answers are "NO", then may proceed with Cephalosporin use.    Social History:  reports that he quit smoking about 35 years ago. His smoking use included cigarettes. He started smoking about 72 years ago. He has never used smokeless tobacco. He reports that he does not drink alcohol or use drugs.  Family History: Family History  Problem Relation Age of Onset  . CVA Father     Physical Exam: Blood pressure (!) 126/109, pulse 73, temperature (!) 97.4 F (36.3 C), temperature source Axillary, resp. rate 20, SpO2 94 %. General: Alert, awake, oriented x3, in no acute distress. Appears chronically frail. Thin.  Eyes: pink conjunctiva,anicteric sclera, pupils equal and reactive to light and accomodation HEENT: normocephalic, atraumatic, oropharynx clear Neck: supple, no masses or lymphadenopathy, no goiter, no bruits, no JVD CVS: Regular rate and rhythm, without murmurs, rubs or gallops. No lower extremity edema Resp : Clear to auscultation bilaterally, no wheezing, rales or rhonchi. GI : Soft, nontender, nondistended, positive bowel sounds, no masses. No hepatomegaly. No hernia.  Musculoskeletal: No clubbing or cyanosis, positive pedal pulses. No contracture. ROM intact  Neuro: Grossly intact, no focal neurological deficits, strength 5/5 upper and lower extremities bilaterally. Patient non-verbal but follows commands and responds with nodding/shaking head appropriately.  Psych: Appears alert.  Skin: no rashes or lesions, warm and dry   LABS on Admission: I have personally reviewed all the labs and imagings below    Basic Metabolic  Panel: Recent Labs  Lab 05/25/2019 1543 05/20/2019 1556  NA 151* 152*  K 5.4* 5.3*  CL 112*  --   CO2 17*  --   GLUCOSE 144*  --   BUN 117*  --   CREATININE 3.93*  --   CALCIUM 9.5  --    Liver Function Tests: Recent Labs  Lab 04/30/2019 1543  AST 30  ALT 23  ALKPHOS 71  BILITOT 1.3*  PROT 8.4*  ALBUMIN 3.1*   No results for input(s): LIPASE, AMYLASE in the last 168 hours. No results for input(s): AMMONIA in the last 168 hours. CBC: Recent Labs  Lab 04/29/2019 1543 05/01/2019 1556  WBC 8.7  --   NEUTROABS 7.4  --   HGB 17.8* 18.4*  HCT 56.2* 54.0*  MCV 90.9  --   PLT 305  --    Cardiac Enzymes: Recent Labs  Lab 05/24/2019 1543  CKTOTAL  225   BNP: Invalid input(s): POCBNP CBG: Recent Labs  Lab 05/27/2019 1633  GLUCAP 118*    Radiological Exams on Admission:  CT HEAD WO CONTRAST  Result Date: 05/17/2019 CLINICAL DATA:  86 year old male with focal neurologic deficit. EXAM: CT HEAD WITHOUT CONTRAST TECHNIQUE: Contiguous axial images were obtained from the base of the skull through the vertex without intravenous contrast. COMPARISON:  Head CT dated 06/23/2016. FINDINGS: Brain: There is moderate age-related atrophy and chronic microvascular ischemic changes. There is no acute intracranial hemorrhage. Mass effect or midline shift. No extra-axial fluid collection. Vascular: No hyperdense vessel or unexpected calcification. Skull: Or no acute calvarial pathology. Bilateral there folds noted. Sinuses/Orbits: There is chronic opacification of the left sphenoid sinus and partial opacification of the left maxillary sinus. The remainder of the visualized paranasal sinuses and mastoid air cells are clear. Other: None IMPRESSION: 1. No acute intracranial hemorrhage. 2. Age-related atrophy and chronic microvascular ischemic changes. Electronically Signed   By: Elgie Collard M.D.   On: 05/09/2019 16:33   DG Chest Portable 1 View  Result Date: 05/27/2019 CLINICAL DATA:  Hypoxia. EXAM:  PORTABLE CHEST 1 VIEW COMPARISON:  May 28, 2016. FINDINGS: The heart size and mediastinal contours are within normal limits. Both lungs are clear. No pneumothorax or pleural effusion is noted. Atherosclerosis of thoracic aorta is noted. The visualized skeletal structures are unremarkable. IMPRESSION: No active disease. Aortic Atherosclerosis (ICD10-I70.0). Electronically Signed   By: Lupita Raider M.D.   On: 05/09/2019 16:30      EKG: Independently reviewed. Sinus tachycardia. No STEMI. No peaked T waves.    Assessment/Plan Active Problems:   Hypertension   Hyperlipidemia   Dementia without behavioral disturbance (HCC)   Hyperglycemia   Glaucoma, right eye   AKI (acute kidney injury) (HCC)   Altered mental status   Elevated troponin   Hyperkalemia   Hypernatremia   Altered Mental Status  Patient presenting with new non-verbal status. Differential remains broad. CT head without acute changes or intracranial hemorrhage. CXR without active disease. No leukocytosis. CK within normal limits. Found to have significant AKI with elevated BUN to 117, uremia may be contributing to presentation. Additionally, patient recently lost his wife so this could potentially be a grief reaction. Recurrence of subdural hematoma less likely as first appeared to be from traumatic event (fall). No known recent traumas. No medications on list that would contribute to this presentation. Patient has a history of dementia per EMR but daughter reports he is typically alert, conversant and able to perform ADLs without assistance. No abnormal vital signs, imaging/lab abnormalities, or symptoms that would suggest infectious etiology. No other neurological abnormalities present on exam.  -admit to MedSurg, telemetry with continuous pulse ox  -MRI brain pending  -check other labs for possible etiologies: TSH, UDS, tylenol level, salicylate level, ammonia, RPR, B12  -UA pending  -SCDs for DVT prophylaxis for now  given h/o subdural hematoma and difficulty obtaining full history while MRI pending  -neuro checks by RN  -NPO pending stroke swallow screen  -PT/OT eval  -consider cultures if remains altered with no other causes found   AKI with electrolyte disturbances  Cr 3.93 and BUN 117. Cr was 1.59 previously on 1/7. BUN to Cr ratio consistent with prerenal etiology. Patient is frail at baseline and reports he has had decreased PO intake. K 5.4 and Na 151. Free water deficit for Na is 1.9L. Suspect hypernatremia from dehydration. Patient has received 2L of fluid in ED. No peaked  T waves on EKG.  -NS at 100 cc/hr overnight  -repeat BMET in AM  -will hold off on treating mild hyperkalemia for now with Kayexalate as this could worsen dehydration  -if kidney function remains poor, would likely benefit from nephro consult    Elevated Troponin  Initial Troponin 39. Likely from AKI. EKG without acute changes.  -trend troponin once more   HTN  Blood pressures currently soft. Has received 2L NS.  -Hold Amlodipine 2.5 mg   HLD  -continue statin    DVT prophylaxis: SCDs   CODE STATUS: DNR   Consults called: None   Family Communication: Admission, patients condition and plan of care including tests being ordered have been discussed with the patient and patient's daughter who indicates understanding and agree with the plan and Code Status  Admission status:   The medical decision making on this patient was of high complexity and the patient is at high risk for clinical deterioration, therefore this is a level 3 admission.  Severity of Illness:     Moderate  The appropriate patient status for this patient is INPATIENT. Inpatient status is judged to be reasonable and necessary in order to provide the required intensity of service to ensure the patient's safety. The patient's presenting symptoms, physical exam findings, and initial radiographic and laboratory data in the context of their chronic  comorbidities is felt to place them at high risk for further clinical deterioration. Furthermore, it is not anticipated that the patient will be medically stable for discharge from the hospital within 2 midnights of admission. The following factors support the patient status of inpatient.   " The patient's presenting symptoms include inability to speak. " The worrisome physical exam findings include non-verbal status, sinus tachycardia at presentation. " The initial radiographic and laboratory data are worrisome because of Cr 3.93, BUN 117, Na 151, K 5.4, Troponin 39. " The chronic co-morbidities include HTN, HLD, h/o subdural hematoma.   * I certify that at the point of admission it is my clinical judgment that the patient will require inpatient hospital care spanning beyond 2 midnights from the point of admission due to high intensity of service, high risk for further deterioration and high frequency of surveillance required.*    Time Spent on Admission: 48 minutes      Melina Schools D.O.  [facility] 2019/06/08, 8:17 PM

## 2019-05-14 NOTE — Progress Notes (Signed)
Patient admitted from ED to 6N18. Alert ,non verbal but will nod head when spoken to. Skin warm and dry to touch.Vital signs stable.Denies c.o pain. Oriented to room and remote.

## 2019-05-15 ENCOUNTER — Inpatient Hospital Stay (HOSPITAL_COMMUNITY): Payer: Medicare Other

## 2019-05-15 DIAGNOSIS — E870 Hyperosmolality and hypernatremia: Secondary | ICD-10-CM | POA: Diagnosis present

## 2019-05-15 DIAGNOSIS — R778 Other specified abnormalities of plasma proteins: Secondary | ICD-10-CM

## 2019-05-15 DIAGNOSIS — E8729 Other acidosis: Secondary | ICD-10-CM | POA: Diagnosis present

## 2019-05-15 DIAGNOSIS — E872 Acidosis: Secondary | ICD-10-CM

## 2019-05-15 DIAGNOSIS — N189 Chronic kidney disease, unspecified: Secondary | ICD-10-CM

## 2019-05-15 DIAGNOSIS — N179 Acute kidney failure, unspecified: Secondary | ICD-10-CM | POA: Diagnosis present

## 2019-05-15 DIAGNOSIS — R4701 Aphasia: Secondary | ICD-10-CM

## 2019-05-15 DIAGNOSIS — U071 COVID-19: Secondary | ICD-10-CM | POA: Diagnosis present

## 2019-05-15 DIAGNOSIS — E7800 Pure hypercholesterolemia, unspecified: Secondary | ICD-10-CM

## 2019-05-15 DIAGNOSIS — G9341 Metabolic encephalopathy: Secondary | ICD-10-CM | POA: Diagnosis present

## 2019-05-15 DIAGNOSIS — E875 Hyperkalemia: Secondary | ICD-10-CM

## 2019-05-15 LAB — GLUCOSE, CAPILLARY
Glucose-Capillary: 131 mg/dL — ABNORMAL HIGH (ref 70–99)
Glucose-Capillary: 148 mg/dL — ABNORMAL HIGH (ref 70–99)
Glucose-Capillary: 88 mg/dL (ref 70–99)

## 2019-05-15 LAB — SARS CORONAVIRUS 2 (TAT 6-24 HRS): SARS Coronavirus 2: POSITIVE — AB

## 2019-05-15 LAB — CBC
HCT: 44 % (ref 39.0–52.0)
Hemoglobin: 14 g/dL (ref 13.0–17.0)
MCH: 28.6 pg (ref 26.0–34.0)
MCHC: 31.8 g/dL (ref 30.0–36.0)
MCV: 89.8 fL (ref 80.0–100.0)
Platelets: 235 10*3/uL (ref 150–400)
RBC: 4.9 MIL/uL (ref 4.22–5.81)
RDW: 14.7 % (ref 11.5–15.5)
WBC: 9.2 10*3/uL (ref 4.0–10.5)
nRBC: 0 % (ref 0.0–0.2)

## 2019-05-15 LAB — RAPID URINE DRUG SCREEN, HOSP PERFORMED
Amphetamines: NOT DETECTED
Barbiturates: NOT DETECTED
Benzodiazepines: NOT DETECTED
Cocaine: NOT DETECTED
Opiates: NOT DETECTED
Tetrahydrocannabinol: NOT DETECTED

## 2019-05-15 LAB — VITAMIN B12: Vitamin B-12: 7231 pg/mL — ABNORMAL HIGH (ref 180–914)

## 2019-05-15 LAB — BASIC METABOLIC PANEL
Anion gap: 13 (ref 5–15)
BUN: 107 mg/dL — ABNORMAL HIGH (ref 8–23)
CO2: 18 mmol/L — ABNORMAL LOW (ref 22–32)
Calcium: 8 mg/dL — ABNORMAL LOW (ref 8.9–10.3)
Chloride: 123 mmol/L — ABNORMAL HIGH (ref 98–111)
Creatinine, Ser: 3.38 mg/dL — ABNORMAL HIGH (ref 0.61–1.24)
GFR calc Af Amer: 18 mL/min — ABNORMAL LOW (ref >60)
GFR calc non Af Amer: 15 mL/min — ABNORMAL LOW (ref >60)
Glucose, Bld: 118 mg/dL — ABNORMAL HIGH (ref 70–99)
Potassium: 5.1 mmol/L (ref 3.5–5.1)
Sodium: 154 mmol/L — ABNORMAL HIGH (ref 135–145)

## 2019-05-15 LAB — LACTATE DEHYDROGENASE: LDH: 262 U/L — ABNORMAL HIGH (ref 98–192)

## 2019-05-15 LAB — LACTIC ACID, PLASMA
Lactic Acid, Venous: 1.8 mmol/L (ref 0.5–1.9)
Lactic Acid, Venous: 2.1 mmol/L (ref 0.5–1.9)

## 2019-05-15 LAB — SALICYLATE LEVEL: Salicylate Lvl: <7 mg/dL — ABNORMAL LOW (ref 7.0–30.0)

## 2019-05-15 LAB — ABO/RH: ABO/RH(D): O POS

## 2019-05-15 LAB — AMMONIA: Ammonia: 29 umol/L (ref 9–35)

## 2019-05-15 LAB — FIBRINOGEN: Fibrinogen: 622 mg/dL — ABNORMAL HIGH (ref 210–475)

## 2019-05-15 LAB — FERRITIN: Ferritin: 410 ng/mL — ABNORMAL HIGH (ref 24–336)

## 2019-05-15 LAB — D-DIMER, QUANTITATIVE: D-Dimer, Quant: 8.43 ug/mL-FEU — ABNORMAL HIGH (ref 0.00–0.50)

## 2019-05-15 LAB — TSH: TSH: 0.594 u[IU]/mL (ref 0.350–4.500)

## 2019-05-15 LAB — ACETAMINOPHEN LEVEL: Acetaminophen (Tylenol), Serum: <10 ug/mL — ABNORMAL LOW (ref 10–30)

## 2019-05-15 LAB — C-REACTIVE PROTEIN: CRP: 10.2 mg/dL — ABNORMAL HIGH (ref <1.0)

## 2019-05-15 LAB — RPR: RPR Ser Ql: NONREACTIVE

## 2019-05-15 LAB — HEMOGLOBIN A1C
Hgb A1c MFr Bld: 6.6 % — ABNORMAL HIGH (ref 4.8–5.6)
Mean Plasma Glucose: 142.72 mg/dL

## 2019-05-15 MED ORDER — SODIUM CHLORIDE 0.9 % IV BOLUS: 500.0000 mL | Freq: Once | INTRAVENOUS | Status: DC

## 2019-05-15 MED ORDER — INSULIN ASPART 100 UNIT/ML ~~LOC~~ SOLN
0.0000 [IU] | SUBCUTANEOUS | Status: DC
  Administered 2019-05-15 – 2019-05-16 (×2): 1 [IU] via SUBCUTANEOUS
  Administered 2019-05-16: 21:00:00 2 [IU] via SUBCUTANEOUS
  Administered 2019-05-16: 09:00:00 1 [IU] via SUBCUTANEOUS
  Administered 2019-05-17: 2 [IU] via SUBCUTANEOUS
  Administered 2019-05-18 – 2019-05-19 (×3): 1 [IU] via SUBCUTANEOUS
  Administered 2019-05-19: 2 [IU] via SUBCUTANEOUS
  Administered 2019-05-19: 18:00:00 1 [IU] via SUBCUTANEOUS
  Administered 2019-05-20 (×2): 2 [IU] via SUBCUTANEOUS
  Administered 2019-05-20: 13:00:00 1 [IU] via SUBCUTANEOUS
  Administered 2019-05-20: 01:00:00 2 [IU] via SUBCUTANEOUS
  Administered 2019-05-21: 05:00:00 1 [IU] via SUBCUTANEOUS
  Administered 2019-05-21: 02:00:00 2 [IU] via SUBCUTANEOUS

## 2019-05-15 MED ORDER — SODIUM CHLORIDE 0.9 % IV SOLN
100.0000 mg | Freq: Every day | INTRAVENOUS | Status: AC
  Administered 2019-05-16 – 2019-05-19 (×4): 100 mg via INTRAVENOUS
  Filled 2019-05-15 (×4): qty 20

## 2019-05-15 MED ORDER — DEXTROSE 5 % IV SOLN: INTRAVENOUS | Status: AC

## 2019-05-15 MED ORDER — SODIUM CHLORIDE 0.9 % IV SOLN
200.0000 mg | Freq: Once | INTRAVENOUS | Status: AC
  Administered 2019-05-15: 16:00:00 200 mg via INTRAVENOUS
  Filled 2019-05-15: qty 200

## 2019-05-15 MED ORDER — DEXAMETHASONE SODIUM PHOSPHATE 10 MG/ML IJ SOLN
6.0000 mg | INTRAMUSCULAR | Status: DC
  Administered 2019-05-15 – 2019-05-21 (×7): 6 mg via INTRAVENOUS
  Filled 2019-05-15 (×7): qty 1

## 2019-05-15 NOTE — Progress Notes (Signed)
Attempted to give report to 5W,RN unavailable.Left phone number.

## 2019-05-15 NOTE — Evaluation (Signed)
Physical Therapy Evaluation Patient Details Name: Henry Harvey MRN: [id number] DOB: 1927-03-13 Today's Date: 05/15/2019   History of Present Illness  Patient is 86 year old male presenting with lack of speech LSN 6pm 1/16. Daughter at bedside states his baseline is able to conduct all ADLs and no altered mental status and able to speak fluently.  Patient is neuro intact on my exam and follows commands well and shakes his head yes and no to questions asked.  Admitted with AMS and AKI with electrolyte disturbance; CT neg; tested pos for covid  Clinical Impression   Pt admitted with above diagnosis. Managing at home alone prior to this admission; Presents to PT with decr functional mobility, decr activity tolerance, pain and soreness with movement, decr balance, incr fall risk;  Pt currently with functional limitations due to the deficits listed below (see PT Problem List). Pt will benefit from skilled PT to increase their independence and safety with mobility to allow discharge to the venue listed below.     Not entirely clear on his home situation and options; It appears he lived at home with his wife, who passed away recently; If he is now at home alone, we will need to consider post-acute rehab at SNF level; Need to find out how much assist is available to him; If family can provide assist, if he can stay with family and have 24 hour assist, we can consider home with HHPT         Follow Up Recommendations SNF;Supervision/Assistance - 24 hour    Equipment Recommendations  Rolling walker with 5" wheels;3in1 (PT)    Recommendations for Other Services       Precautions / Restrictions Precautions Precautions: Fall      Mobility  Bed Mobility Overal bed mobility: Needs Assistance Bed Mobility: Supine to Sit;Sit to Supine     Supine to sit: Mod assist;+2 for physical assistance Sit to supine: Mod assist   General bed mobility comments: Mod assist to help move LEs to EOB and clear  side of bed; mod assist to elelvate trunk to sit and square off hips at EOB  Transfers Overall transfer level: Needs assistance Equipment used: 2 person hand held assist Transfers: Sit to/from Stand Sit to Stand: Mod assist         General transfer comment: 2 person giving bilateral support at gait belt; light mod assist to power up and gain balance once standing  Ambulation/Gait Ambulation/Gait assistance: Mod assist;+2 physical assistance Gait Distance (Feet): (sidestesp towards HOB) Assistive device: 2 person hand held assist Gait Pattern/deviations: Shuffle     General Gait Details: Performed a few sidesteps toward [facility] to his R; bilateral assist for support while stepping  Stairs            Wheelchair Mobility    Modified Rankin (Stroke Patients Only)       Balance Overall balance assessment: Needs assistance Sitting-balance support: Feet supported Sitting balance-Leahy Scale: Fair       Standing balance-Leahy Scale: Poor                               Pertinent Vitals/Pain Pain Assessment: Faces Faces Pain Scale: Hurts even more Pain Location: Generalized; Flinches in pain with movements; also reports Headache posterior part of head Pain Descriptors / Indicators: Grimacing;Guarding;Headache Pain Intervention(s): Monitored during session;Repositioned    Home Living Family/patient expects to be discharged to:: Private residence Living Arrangements: Alone(Spouse passed a few weeks  ago) Available Help at Discharge: Family;Available PRN/intermittently(Daughter lives near) Type of Home: House Home Access: Stairs to enter Entrance Stairs-Rails: None Entrance Stairs-Number of Steps: 3 Home Layout: One level Home Equipment: Environmental consultant - 2 wheels;Shower seat;Cane - single point Additional Comments: Will need to verify above information    Prior Function Level of Independence: Needs assistance   Gait / Transfers Assistance Needed: with cane per  patient     Comments: Need more information re: home assist     Hand Dominance   Dominant Hand: Right    Extremity/Trunk Assessment   Upper Extremity Assessment Upper Extremity Assessment: Defer to OT evaluation    Lower Extremity Assessment Lower Extremity Assessment: Generalized weakness(Grimaces with movement of bil LEs; ROM grossly WFL throughout)       Communication   Communication: No difficulties(but slwo to answer questions)  Cognition Arousal/Alertness: Awake/alert Behavior During Therapy: WFL for tasks assessed/performed Overall Cognitive Status: Impaired/Different from baseline Area of Impairment: Orientation;Memory;Safety/judgement;Awareness;Following commands                 Orientation Level: Disoriented to;Place;Time;Situation(Close with place: "[employer]")   Memory: Decreased short-term memory(Noted possible dementia in MD notes) Following Commands: Follows one step commands with increased time Safety/Judgement: Decreased awareness of safety;Decreased awareness of deficits            General Comments General comments (skin integrity, edema, etc.): No DOE noted on Room Air; difficulty getting O2 sat reading    Exercises     Assessment/Plan    PT Assessment Patient needs continued PT services  PT Problem List Decreased strength;Decreased activity tolerance;Decreased balance;Decreased mobility;Decreased coordination;Decreased cognition;Decreased knowledge of use of DME;Decreased safety awareness;Decreased knowledge of precautions;Cardiopulmonary status limiting activity       PT Treatment Interventions DME instruction;Gait training;Stair training;Functional mobility training;Therapeutic activities;Therapeutic exercise;Balance training;Neuromuscular re-education;Cognitive remediation;Patient/family education    PT Goals (Current goals can be found in the Care Plan section)  Acute Rehab PT Goals Patient Stated Goal: Initailly did not want to  move, tehn agreed to sitting EOB PT Goal Formulation: Patient unable to participate in goal setting Time For Goal Achievement: 05/29/19 Potential to Achieve Goals: Fair    Frequency Min 3X/week(May need to update freq as dc plan becomes mroe clear)   Barriers to discharge Other (comment) Not entirely clear on his home situation and options; It appears he lived at home with his wife, who passed away recently; If he is now at home alone, we will need to consider post-acute rehab at SNF level; Need to find out haw much assist is available to him    Co-evaluation PT/OT/SLP Co-Evaluation/Treatment: Yes Reason for Co-Treatment: Necessary to address cognition/behavior during functional activity;For patient/therapist safety;To address functional/ADL transfers PT goals addressed during session: Mobility/safety with mobility         AM-PAC PT "6 Clicks" Mobility  Outcome Measure Help needed turning from your back to your side while in a flat bed without using bedrails?: A Little Help needed moving from lying on your back to sitting on the side of a flat bed without using bedrails?: A Lot Help needed moving to and from a bed to a chair (including a wheelchair)?: A Lot Help needed standing up from a chair using your arms (e.g., wheelchair or bedside chair)?: A Lot Help needed to walk in hospital room?: A Lot Help needed climbing 3-5 steps with a railing? : A Lot 6 Click Score: 13    End of Session Equipment Utilized During Treatment: Gait belt Activity Tolerance:  Patient tolerated treatment well Patient left: in bed;with call bell/phone within reach;with bed alarm set Nurse Communication: Mobility status PT Visit Diagnosis: Unsteadiness on feet (R26.81);Other abnormalities of gait and mobility (R26.89);Muscle weakness (generalized) (M62.81)    Time: 3968-8648 PT Time Calculation (min) (ACUTE ONLY): 17 min   Charges:   PT Evaluation $PT Eval Moderate Complexity: 1 Mod           Roney Marion, [place]  Acute Rehabilitation Services Pager [phone] Office [phone]   Colletta [place] 05/15/2019, 5:45 PM

## 2019-05-15 NOTE — Progress Notes (Signed)
OT Cancellation Note  Patient Details Name: Henry Harvey MRN: [id number] DOB: 12-14-1926   Cancelled Treatment:    Reason Eval/Treat Not Completed: Patient at procedure or test/ unavailable (MRI), will follow up for OT eval as schedule permits.  Marcy Siren, OT Supplemental Rehabilitation Services Pager [phone] Office [phone]    [name] 05/15/2019, 12:30 PM

## 2019-05-15 NOTE — Progress Notes (Signed)
Swallowing screen done. Pt failed. MD notified. SLP consulted.

## 2019-05-15 NOTE — Progress Notes (Signed)
PT Cancellation Note  Patient Details Name: Henry Harvey MRN: [id number] DOB: 1926/05/30   Cancelled Treatment:    Reason Eval/Treat Not Completed: Patient at procedure or test/unavailable   Will follow up later today as time allows;  Otherwise, will follow up for PT tomorrow;   Thank you,  Van Clines, PT  Acute Rehabilitation Services Pager [phone] Office [phone]     Levi Aland 05/15/2019, 12:31 PM

## 2019-05-15 NOTE — Progress Notes (Signed)
Pt arrived to 5W from ED. Pt is alert and oriented times 1. Pt would only nod and/or say one word answers. Skin check was completed by the required to RN's, no skin issues to report. Pt was educated on the call bell and the phone system. Swallowing screen attempted but patient refused to open mouth. Pt was not fully able to answer admission questions.

## 2019-05-15 NOTE — Plan of Care (Signed)
  Problem: Clinical Measurements: Goal: Diagnostic test results will improve Outcome: Progressing   

## 2019-05-15 NOTE — Progress Notes (Addendum)
PROGRESS NOTE   Henry Harvey  OEU:[id number]    DOB: 1926-09-15    DOA: 05/16/2019  PCP: Glendale Chard, MD   I have briefly reviewed patients previous medical records in [facility].  Chief Complaint:  Unable to speak.   Brief Narrative:  86 year old recently widowed male, PMH of HTN, HLD, prior subdural hematoma s/p bur hole 2018,?  Dementia, recently lost his wife, last known normal was day prior to admission, presented with aphasia and altered mental status.  At baseline reportedly alert, oriented, active, able to conduct all activities of daily living and take care of himself.  Admitted for aphasia, acute metabolic encephalopathy, dehydration with hypernatremia, hyperkalemia, acute on chronic kidney disease and COVID-19 positive.   Assessment & Plan:  Principal Problem:   Acute metabolic encephalopathy Active Problems:   Hypertension   Hyperlipidemia   Elevated troponin   Hyperkalemia   Aphasia   High anion gap metabolic acidosis   [id number] virus infection   Acute kidney injury superimposed on CKD (HCC)   Dehydration with hypernatremia   Aphasia  Unclear etiology.  Suspect all metabolic but cannot definitely rule out acute stroke.  CT head without acute findings.  MRI brain pending.  Improved and oriented to self and partly to place, follows simple instructions this morning.  Monitor closely.  Acute metabolic encephalopathy  Most likely related to dehydration with hypernatremia, acute on chronic kidney disease in the context of COVID-19 infection and not sure if has an element of mild underlying undiagnosed dementia.  CT head without acute findings.  Chest x-ray without acute disease.  B12: 7231.  TSH normal/0.594.  Urine microscopy without UTI features.  UDS negative.  Treat underlying cause and monitor.  Seems to be better than initial arrival to the hospital.  He may have underlying grief reaction from recent spouse's demise but this needs to be reassessed after  correcting metabolic abnormalities.  Acute kidney injury complicating stage IIIa chronic kidney disease.    Baseline creatinine possibly in the 1.2-1.5 range.  Presented with creatinine of 3.93.  Renal ultrasound shows bilateral renal atrophy without hydronephrosis.  Avoid nephrotoxins.  Not on ACEI/ARB or diuretics PTA.  Hydrate aggressively with IV fluids and follow BMP daily.  Hyperkalemia  Secondary to acute kidney injury.  Presented with potassium of 5.4 which has improved to 5.1.  Follow BMP daily.  Anion gap metabolic acidosis  Secondary to acute kidney injury.  Presented with bicarbonate of 17, anion gap of 22.  Treat acute kidney injury and follow BMP.  Dehydration with hypernatremia  Sodium has increased from 151 on admission to 154.  Change IV fluids to D5 infusion and follow BMP.  Acute respiratory failure with hypoxia COVID-19 infection  It is possible that this precipitated poor oral intake and associated acute kidney injury with complications.  Trend inflammatory markers.  IV remdesivir and IV Decadron.  Patient was saturating 89% on room air early this morning, 91% on 2 L/min.  Dysphagia  Suspect mostly due to acute metabolic encephalopathy rather than stroke.  Patient failed bedside RN stroke swallow screen.  ST consulted for swallow evaluation.  Essential hypertension  Normal or hypotensive likely due to volume depletion.  Hold antihypertensive/amlodipine.  Hyperlipidemia  Statins when able.  DM2 with renal complications  P6P 6.6.  NovoLog SSI.  If CBGs markedly elevated from D5 infusion or steroids, will consider initiating Levemir.  Hyperlipidemia  Continue statins.  Elevated troponin  No anginal symptoms reported.  EKG without acute changes.  Low level and flat trend.  Low index of suspicion for ACS.  Body mass index is 19.77 kg/m.  Consult dietitian after swallow evaluation.  Nutritional Status        DVT prophylaxis: SCDs Code Status:  DNR Family Communication: None at bedside.  I discussed with patient's daughter in detail via phone, updated care and answered questions.  She did indicate that patient has history of dementia, recently failed a test done in the PCPs office.  She is a retired Child psychotherapist. Disposition: To be determined pending clinical improvement.  May take several days.  Consultants:   None  Procedures:   None  Antimicrobials:   None   Subjective:  Patient is alert and oriented to self and partly to place "[facility]".  Follows simple instructions.  Denies cough, dyspnea, pain, nausea or vomiting.  Indicates that he is very thirsty.  Objective:   Vitals:   05/15/19 0535 05/15/19 0612 05/15/19 0732 05/15/19 1041  BP: 126/67 126/67  98/69  Pulse: 88 88  78  Resp: (!) 28 (!) 26  (!) 27  Temp: 97.9 F (36.6 C) 97.9 F (36.6 C)  97.6 F (36.4 C)  TempSrc: Axillary Axillary  Axillary  SpO2: (!) 89%  91% 94%  Height:  5' 1.8" (1.57 m)      General exam: Elderly male, moderately built, thinly nourished, chronically ill looking, disheveled, has undressed himself, pulling at staff.  Lying comfortably supine in bed without distress.  Oral mucosa dry. Respiratory system: Clear to auscultation. Respiratory effort normal. Cardiovascular system: S1 & S2 heard, RRR. No JVD, murmurs, rubs, gallops or clicks. No pedal edema.  Telemetry personally reviewed: Sinus rhythm with occasional PVCs. Gastrointestinal system: Abdomen is nondistended, soft and nontender. No organomegaly or masses felt. Normal bowel sounds heard. Central nervous system: Mental status as noted above. No focal neurological deficits. Extremities: Symmetric 5 x 5 power. Skin: No rashes, lesions or ulcers Psychiatry: Judgement and insight impaired. Mood & affect cannot be assessed at this time.     Data Reviewed:   I have personally reviewed following labs and imaging studies   CBC: Recent Labs  Lab 05/11/2019 1543  05/12/2019 1556 05/15/19 0029  WBC 8.7  --  9.2  NEUTROABS 7.4  --   --   HGB 17.8* 18.4* 14.0  HCT 56.2* 54.0* 44.0  MCV 90.9  --  89.8  PLT 305  --  235    Basic Metabolic Panel: Recent Labs  Lab 05/07/2019 1543 05/13/2019 1556 05/15/19 0029  NA 151* 152* 154*  K 5.4* 5.3* 5.1  CL 112*  --  123*  CO2 17*  --  18*  GLUCOSE 144*  --  118*  BUN 117*  --  107*  CREATININE 3.93*  --  3.38*  CALCIUM 9.5  --  8.0*    Liver Function Tests: Recent Labs  Lab 05/24/2019 1543  AST 30  ALT 23  ALKPHOS 71  BILITOT 1.3*  PROT 8.4*  ALBUMIN 3.1*    CBG: Recent Labs  Lab 05/01/2019 1633  GLUCAP 118*    Microbiology Studies:   Recent Results (from the past 240 hour(s))  SARS CORONAVIRUS 2 (TAT 6-24 HRS) Nasopharyngeal Nasopharyngeal Swab     Status: Abnormal   Collection Time: 05/16/2019  4:47 PM   Specimen: Nasopharyngeal Swab  Result Value Ref Range Status   SARS Coronavirus 2 POSITIVE (A) NEGATIVE Final    Comment: RESULT CALLED TO, READ BACK BY AND VERIFIED WITH:  N. CAGUIOA,RN [id number] 05/15/2019 T. TYSOR (NOTE) SARS-CoV-2 target nucleic acids are DETECTED. The SARS-CoV-2 RNA is generally detectable in upper and lower respiratory specimens during the acute phase of infection. Positive results are indicative of the presence of SARS-CoV-2 RNA. Clinical correlation with patient history and other diagnostic information is  necessary to determine patient infection status. Positive results do not rule out bacterial infection or co-infection with other viruses.  The expected result is Negative. Fact Sheet for Patients: HairSlick.no Fact Sheet for Healthcare Providers: quierodirigir.com This test is not yet approved or cleared by the [place] FDA and  has been authorized for detection and/or diagnosis of SARS-CoV-2 by FDA under an Emergency Use Authorization (EUA). This EUA will remain  in effect (meaning this test can be  used) for  the duration of the COVID-19 declaration under Section 564(b)(1) of the Act, 21 U.S.C. section 360bbb-3(b)(1), unless the authorization is terminated or revoked sooner. Performed at [facility], [address], [place], [place] [address]      Radiology Studies:  CT HEAD WO CONTRAST  Result Date: 05/04/2019 CLINICAL DATA:  86 year old male with focal neurologic deficit. EXAM: CT HEAD WITHOUT CONTRAST TECHNIQUE: Contiguous axial images were obtained from the base of the skull through the vertex without intravenous contrast. COMPARISON:  Head CT dated 06/23/2016. FINDINGS: Brain: There is moderate age-related atrophy and chronic microvascular ischemic changes. There is no acute intracranial hemorrhage. Mass effect or midline shift. No extra-axial fluid collection. Vascular: No hyperdense vessel or unexpected calcification. Skull: Or no acute calvarial pathology. Bilateral there folds noted. Sinuses/Orbits: There is chronic opacification of the left sphenoid sinus and partial opacification of the left maxillary sinus. The remainder of the visualized paranasal sinuses and mastoid air cells are clear. Other: None IMPRESSION: 1. No acute intracranial hemorrhage. 2. Age-related atrophy and chronic microvascular ischemic changes. Electronically Signed   By: Elgie Collard M.D.   On: 05/06/2019 16:33   US Renal  Result Date: 05/15/2019 CLINICAL DATA:  Acute kidney injury. EXAM: RENAL / URINARY TRACT ULTRASOUND COMPLETE COMPARISON:  None. FINDINGS: Right Kidney: Renal measurements: 9.0 x 4.5 x 3.6 cm = volume: 73 mL. Increased echogenicity of renal parenchyma is noted suggesting medical renal disease. No mass or hydronephrosis visualized. Left Kidney: Renal measurements: 7.3 x 4.8 x 4.5 cm = volume: 84 mL. Mildly increased echogenicity of renal parenchyma is noted. No mass or hydronephrosis visualized. Bladder: Possible small bladder diverticulum is noted. Mild prostatic enlargement is  noted. Other: None. IMPRESSION: Bilateral renal atrophy is noted. Increased echogenicity of renal parenchyma is noted suggesting medical renal disease. Probable small bladder diverticulum is noted. Mild prostatic enlargement. Electronically Signed   By: Lupita Raider M.D.   On: 05/15/2019 10:06   DG Chest Portable 1 View  Result Date: 05/25/2019 CLINICAL DATA:  Hypoxia. EXAM: PORTABLE CHEST 1 VIEW COMPARISON:  May 28, 2016. FINDINGS: The heart size and mediastinal contours are within normal limits. Both lungs are clear. No pneumothorax or pleural effusion is noted. Atherosclerosis of thoracic aorta is noted. The visualized skeletal structures are unremarkable. IMPRESSION: No active disease. Aortic Atherosclerosis (ICD10-I70.0). Electronically Signed   By: Lupita Raider M.D.   On: 05/07/2019 16:30     Scheduled Meds:   . atorvastatin  20 mg Oral Daily  . dorzolamide-timolol  1 drop Right Eye BID    Continuous Infusions:   . dextrose 100 mL/hr at 05/15/19 0729  . sodium chloride       LOS:  1 day     Marcellus Scott, MD, [place], [facility]. [facility]    To contact the attending provider between 7A-7P or the covering provider during after hours 7P-7A, please log into the web site [email or web address] and access using universal [place] password for that web site. If you do not have the password, please call the hospital operator.  05/15/2019, 1:16 PM

## 2019-05-15 NOTE — Progress Notes (Signed)
Notified  (daughter)  Josephine Igo  that patient was transferred to [id number].

## 2019-05-15 NOTE — Progress Notes (Addendum)
CRITICAL VALUE ALERT  Critical Value:  Lactic Acid  Date & Time Notied:  05/15/2019 6:36am  Provider Notified: Bruna Potter   Orders Received/Actions taken:

## 2019-05-15 NOTE — Progress Notes (Signed)
Swallowing screen attempted but patient refused to open mouth.

## 2019-05-15 NOTE — Evaluation (Signed)
Occupational Therapy Evaluation Patient Details Name: Henry Harvey MRN: [id number] DOB: 21-Aug-1926 Today's Date: 05/15/2019    History of Present Illness Patient is 86 year old male presenting with lack of speech LSN 6pm 1/16. Daughter at bedside states his baseline is able to conduct all ADLs and no altered mental status and able to speak fluently.  Patient is neuro intact on my exam and follows commands well and shakes his head yes and no to questions asked.  Admitted with AMS and AKI with electrolyte disturbance; CT neg; tested pos for covid   Clinical Impression   This 86 y/o male presents with the above. PTA pt residing at home, per chart spouse passed approx 1 week PTA, reports use of SPC for mobility and no assist required for ADL. Pt verbalizing most responses to therapists this session, presenting with fatigue, overall weakness, decreased standing balance and functional/mobility status. Pt currently requiring two person assist for safe completion of functional transfers, minA for seated UB ADL and maxA for LB ADL. Pt will benefit from continued acute OT services, currently recommend SNF level therapies at time of discharge (pending availability of 24hr assist/supervision at home) to maximize pt's safety and independence with ADL and mobility. Will follow.     Follow Up Recommendations  SNF;Supervision/Assistance - 24 hour    Equipment Recommendations  3 in 1 bedside commode;Other (comment)(dfer to next venue)           Precautions / Restrictions Precautions Precautions: Fall Restrictions Weight Bearing Restrictions: No      Mobility Bed Mobility Overal bed mobility: Needs Assistance Bed Mobility: Supine to Sit;Sit to Supine     Supine to sit: Mod assist;+2 for physical assistance Sit to supine: Mod assist   General bed mobility comments: Mod assist to help move LEs to EOB and clear side of bed; mod assist to elelvate trunk to sit and square off hips at  EOB  Transfers Overall transfer level: Needs assistance Equipment used: 2 person hand held assist Transfers: Sit to/from Stand Sit to Stand: Mod assist         General transfer comment: 2 person giving bilateral support at gait belt; light mod assist to power up and gain balance once standing    Balance Overall balance assessment: Needs assistance Sitting-balance support: Feet supported Sitting balance-Leahy Scale: Fair       Standing balance-Leahy Scale: Poor                             ADL either performed or assessed with clinical judgement   ADL Overall ADL's : Needs assistance/impaired Eating/Feeding: NPO   Grooming: Min guard;Set up;Wash/dry face;Sitting Grooming Details (indicate cue type and reason): sitting EOB Upper Body Bathing: Minimal assistance;Sitting   Lower Body Bathing: Maximal assistance;Sit to/from stand;Sitting/lateral leans   Upper Body Dressing : Minimal assistance;Sitting   Lower Body Dressing: Maximal assistance;Sit to/from stand;Sitting/lateral leans;+2 for safety/equipment;+2 for physical assistance       Toileting- Clothing Manipulation and Hygiene: Total assistance;+2 for physical assistance;+2 for safety/equipment;Sit to/from stand       Functional mobility during ADLs: Moderate assistance;Maximal assistance;+2 for physical assistance;+2 for safety/equipment(HHA) General ADL Comments: pt with weakness, pain, decreased activity tolerance and mobility status     Vision         Perception     Praxis      Pertinent Vitals/Pain Pain Assessment: Faces Faces Pain Scale: Hurts even more Pain Location: Generalized; Flinches in pain with  movements; also reports Headache posterior part of head Pain Descriptors / Indicators: Grimacing;Guarding;Headache Pain Intervention(s): Limited activity within patient's tolerance;Monitored during session;Repositioned     Hand Dominance Right   Extremity/Trunk Assessment Upper  Extremity Assessment Upper Extremity Assessment: Generalized weakness   Lower Extremity Assessment Lower Extremity Assessment: Defer to PT evaluation       Communication Communication Communication: No difficulties(but slwo to answer questions)   Cognition Arousal/Alertness: Awake/alert Behavior During Therapy: WFL for tasks assessed/performed Overall Cognitive Status: Impaired/Different from baseline Area of Impairment: Orientation;Memory;Safety/judgement;Awareness;Following commands                 Orientation Level: Disoriented to;Place;Time;Situation(Close with place: "[facility]")   Memory: Decreased short-term memory(Noted possible dementia in MD notes) Following Commands: Follows one step commands with increased time Safety/Judgement: Decreased awareness of safety;Decreased awareness of deficits         General Comments  No DOE noted on Room Air; difficulty getting O2 sat reading    Exercises     Shoulder Instructions      Home Living Family/patient expects to be discharged to:: Private residence Living Arrangements: Alone(Spouse passed a few weeks ago) Available Help at Discharge: Family;Available PRN/intermittently(Daughter lives near) Type of Home: House Home Access: Stairs to enter Technical brewer of Steps: 3 Entrance Stairs-Rails: None Home Layout: One level     Bathroom Shower/Tub: Teacher, early years/pre: Handicapped height(Might need to be verified)     Home Equipment: Environmental consultant - 2 wheels;Shower seat;Cane - single point   Additional Comments: Will need to verify above information      Prior Functioning/Environment Level of Independence: Needs assistance  Gait / Transfers Assistance Needed: with cane per patient     Comments: Need more information re: home assist        OT Problem List: Decreased strength;Decreased range of motion;Decreased activity tolerance;Impaired balance (sitting and/or standing);Decreased  cognition;Decreased safety awareness;Decreased knowledge of use of DME or AE;Cardiopulmonary status limiting activity;Pain      OT Treatment/Interventions: Self-care/ADL training;Therapeutic exercise;Energy conservation;DME and/or AE instruction;Therapeutic activities;Patient/family education;Balance training;Cognitive remediation/compensation    OT Goals(Current goals can be found in the care plan section) Acute Rehab OT Goals Patient Stated Goal: Initailly did not want to move, tehn agreed to sitting EOB OT Goal Formulation: With patient Time For Goal Achievement: 05/29/19 Potential to Achieve Goals: Good ADL Goals Pt Will Perform Grooming: with set-up;sitting Pt Will Perform Lower Body Bathing: with supervision;sitting/lateral leans;sit to/from stand Pt Will Perform Upper Body Dressing: with set-up;sitting Pt Will Perform Lower Body Dressing: with supervision;sit to/from stand;sitting/lateral leans Pt Will Transfer to Toilet: with supervision;ambulating;stand pivot transfer Pt Will Perform Toileting - Clothing Manipulation and hygiene: with supervision;sit to/from stand;sitting/lateral leans  OT Frequency: Min 2X/week   Barriers to D/C:            Co-evaluation PT/OT/SLP Co-Evaluation/Treatment: Yes Reason for Co-Treatment: Necessary to address cognition/behavior during functional activity;For patient/therapist safety;To address functional/ADL transfers PT goals addressed during session: Mobility/safety with mobility OT goals addressed during session: ADL's and self-care      AM-PAC OT "6 Clicks" Daily Activity     Outcome Measure Help from another person eating meals?: Total(NPO) Help from another person taking care of personal grooming?: A Little Help from another person toileting, which includes using toliet, bedpan, or urinal?: Total Help from another person bathing (including washing, rinsing, drying)?: A Lot Help from another person to put on and taking off regular upper  body clothing?: A Lot Help from another person  to put on and taking off regular lower body clothing?: A Lot 6 Click Score: 11   End of Session Equipment Utilized During Treatment: Gait belt Nurse Communication: Mobility status  Activity Tolerance: Patient tolerated treatment well;Patient limited by fatigue Patient left: in bed;with call bell/phone within reach;with bed alarm set;with restraints reapplied  OT Visit Diagnosis: Unsteadiness on feet (R26.81);Muscle weakness (generalized) (M62.81);Other symptoms and signs involving cognitive function                Time: 0981-1914 OT Time Calculation (min): 17 min Charges:  OT General Charges $OT Visit: 1 Visit OT Evaluation $OT Eval Moderate Complexity: 1 Mod  Marcy Siren, OT [employer] Pager [phone] Office [phone]  [name] 05/15/2019, 5:56 PM

## 2019-05-15 NOTE — Progress Notes (Signed)
Patient confirmed positive COVID,MD on call notified, new order to transfer patient.

## 2019-05-16 LAB — GLUCOSE, CAPILLARY
Glucose-Capillary: 95 mg/dL (ref 70–99)
Glucose-Capillary: 141 mg/dL — ABNORMAL HIGH (ref 70–99)
Glucose-Capillary: 107 mg/dL — ABNORMAL HIGH (ref 70–99)
Glucose-Capillary: 124 mg/dL — ABNORMAL HIGH (ref 70–99)
Glucose-Capillary: 160 mg/dL — ABNORMAL HIGH (ref 70–99)

## 2019-05-16 MED ORDER — ORAL CARE MOUTH RINSE
15.0000 mL | Freq: Two times a day (BID) | OROMUCOSAL | Status: DC
  Administered 2019-05-17 – 2019-05-21 (×10): 15 mL via OROMUCOSAL

## 2019-05-16 MED ORDER — CHLORHEXIDINE GLUCONATE 0.12 % MT SOLN
15.0000 mL | Freq: Two times a day (BID) | OROMUCOSAL | Status: DC
  Administered 2019-05-17 – 2019-05-21 (×10): 15 mL via OROMUCOSAL
  Filled 2019-05-16 (×8): qty 15

## 2019-05-16 MED ORDER — DEXTROSE 5 % IV SOLN: INTRAVENOUS | Status: AC

## 2019-05-16 NOTE — Progress Notes (Signed)
Patient continues to refuse care. Patient constricts body and clenches fists whenever attempting to check blood sugars, labs, and while getting cleaned up. Still unable to assess orientation status and perform neuro checks, patient grunts and says "no!" and "stop!" when spoken to or asked to do something. Attempted to give patient emotional support throughout the shift but unsuccessful. MD aware of patient's behaviors. Will continue to monitor and make patient comfortable.

## 2019-05-16 NOTE — Progress Notes (Signed)
Initial Nutrition Assessment  RD working remotely.  DOCUMENTATION CODES:   Not applicable, suspect some degree of malnutrition but unable to confirm  INTERVENTION:   If pt unable to have diet advanced after SLP reevaluation tomorrow and this is within his GOC, recommend Cortrak placement and initiation of enteral nutrition. Recommend: - Osmolite 1.2 @ 50 ml/hr (1200 ml/day)  Recommended tube feeding regimen would provide 1440 kcal, 67 grams of protein, and 984 ml of H2O.  - Please obtain admission weight  NUTRITION DIAGNOSIS:   Inadequate oral intake related to lethargy/confusion as evidenced by NPO status.  GOAL:   Patient will meet greater than or equal to 90% of their needs  MONITOR:   Diet advancement, Labs, Weight trends, I & O's  REASON FOR ASSESSMENT:   Malnutrition Screening Tool    ASSESSMENT:   86 year old male who presented to the ED on 1/16 with aphasia and AMS. PMH of dementia, HTN, HLD, SDH. Pt found to be COVID-19 positive. Pt admitted with AKI.   Per SLP note, pt did not accept any POs offered despite max cues. SLP unable to make diet recommendations at this time.  Noted MD has consulted PMT regarding GOC.  Per RN, SLP to reevaluate pt tomorrow for diet advancement.  If pt unable to have diet advanced after SLP reevaluation tomorrow and within GOC, recommend Cortrak placement and initiation of enteral nutrition. Pt likely with severe malnutrition but RD unable to confirm without NFPE.  No admission weight available.  Medications reviewed and include: decadron, SSI, remdesivir  Labs reviewed: sodium 154, chloride 123, BUN 107, creatinine 3.38, ionized calcium 1.06 CBG's: 88-148 x 24 hours  UOP: 1000 ml x 24 hours I/O's: +1.0 L since admit  NUTRITION - FOCUSED PHYSICAL EXAM:  Unable to complete at this time. RD working remotely.  Diet Order:   Diet Order            Diet NPO time specified  Diet effective now              EDUCATION  NEEDS:   Not appropriate for education at this time  Skin:  Skin Assessment: Reviewed RN Assessment  Last BM:  no documented BM  Height:   Ht Readings from Last 1 Encounters:  05/15/19 5' 1.8" (1.57 m)    Weight:   Wt Readings from Last 1 Encounters:  05/05/19 48.7 kg    Ideal Body Weight:  53.1 kg  BMI:  Body mass index is 19.77 kg/m.  Estimated Nutritional Needs:   Kcal:  1400-1600  Protein:  60-75 grams  Fluid:  1.4-1.6 L    Earma Reading, MS, RD, LDN Inpatient Clinical Dietitian Pager: [phone] Weekend/After Hours: [phone]

## 2019-05-16 NOTE — Progress Notes (Signed)
PROGRESS NOTE   Henry Harvey  WCH:[id number]    DOB: December 09, 1926    DOA: 05/19/2019  PCP: Dorothyann Peng, MD   I have briefly reviewed patients previous medical records in [facility].  Chief Complaint:  Unable to speak.   Brief Narrative:  86 year old recently widowed male, PMH of HTN, HLD, prior subdural hematoma s/p bur hole 2018, suspected dementia, recently lost his wife, last known normal was day prior to admission, presented with aphasia and altered mental status.  At baseline reportedly alert, oriented, active, able to conduct all activities of daily living and take care of himself.  Admitted for aphasia, acute metabolic encephalopathy, dehydration with hypernatremia, hyperkalemia, acute on chronic kidney disease and COVID-19 positive.   Assessment & Plan:  Principal Problem:   Acute metabolic encephalopathy Active Problems:   Hypertension   Hyperlipidemia   Elevated troponin   Hyperkalemia   Aphasia   High anion gap metabolic acidosis   COVID-19 virus infection   Acute kidney injury superimposed on CKD (HCC)   Dehydration with hypernatremia   Aphasia  Unclear etiology.  Suspect all metabolic complicating underlying dementia.  CT head without acute findings.  MRI brain negative for acute stroke.  Mental status was better yesterday morning but worse again today.  Although awake, keeps eyes closed, refuses to speak, nods occasionally, not cooperative with exam, holding arms tightly across his chest.  Acute metabolic encephalopathy  Most likely related to dehydration with hypernatremia, acute on chronic kidney disease in the context of COVID-19 infection complicating underlying dementia.  CT head and MRI brain without acute findings.  Chest x-ray without acute disease.  B12: 7231.  TSH normal/0.594.  Urine microscopy without UTI features.  UDS negative.  Treat underlying cause and monitor.  No consistent improvement in mental status since admission.  He may have underlying  grief reaction from recent spouse's demise but this needs to be reassessed after correcting metabolic abnormalities.  Acute kidney injury complicating stage IIIa chronic kidney disease.    Baseline creatinine possibly in the 1.2-1.5 range.  Presented with creatinine of 3.93.  Renal ultrasound shows bilateral renal atrophy without hydronephrosis.  Avoid nephrotoxins.  Not on ACEI/ARB or diuretics PTA.  Hydrate aggressively with IV fluids and follow BMP daily.  Patient has repeatedly refused lab draws very early this morning and then again midmorning.  Discussed with nursing and to get labs drawn when patient cooperative.  Unfortunately not having labs limits our ability to appropriately care for him.    Hyperkalemia  Secondary to acute kidney injury.  Presented with potassium of 5.4 which has improved to 5.1.  Follow BMP daily, please see discussion above, patient refusing labs.  Anion gap metabolic acidosis  Secondary to acute kidney injury.  Presented with bicarbonate of 17, anion gap of 22.  Treat acute kidney injury and follow BMP, please see discussion above, patient refusing labs.  Dehydration with hypernatremia  Sodium has increased from 151 on admission to 154.  Change IV fluids to D5 infusion and follow BMP, patient refusing labs.  Acute respiratory failure with hypoxia COVID-19 infection  It is possible that this precipitated poor oral intake and associated acute kidney injury with complications.  Trend inflammatory markers.  IV remdesivir and IV Decadron.  Patient was saturating 89% on room air early this morning, 91% on 2 L/min.  Dysphagia  Suspect mostly due to acute metabolic encephalopathy rather than stroke.  Patient failed bedside RN stroke swallow screen.  ST consulted for swallow evaluation but  will likely not cooperate  Essential hypertension  Normotensive.  Hold antihypertensive/amlodipine.  Hyperlipidemia  Statins when able.  DM2 with renal complications  P3A  6.6.  NovoLog SSI.  If CBGs markedly elevated from D5 infusion or steroids, will consider initiating Levemir.  CBGs reasonably controlled.  Hyperlipidemia  Continue statins.  Elevated troponin  No anginal symptoms reported.  EKG without acute changes.  Low level and flat trend.  Low index of suspicion for ACS.  Body mass index is 19.77 kg/m./Severe protein calorie malnutrition  Consult dietitian after swallow evaluation.  Adult failure to thrive  Multifactorial due to acute illness as noted above complicating underlying dementia and recent loss of spouse.  PMT consulted for goals of care.  Unsure if psychiatry will be able to contribute much since patient not willing to participate and has acute encephalopathy.   DVT prophylaxis: SCDs Code Status: DNR Family Communication: None at bedside.  I discussed with patient's daughter in detail via phone, updated care and answered questions.  She did indicate that patient has history of dementia, recently failed a test done in the PCPs office.  She is a retired Education officer, [employer]. Disposition: To be determined pending clinical improvement.  May take several days.  Consultants:   None  Procedures:   None  Antimicrobials:   None   Subjective:  Overnight events noted.  Refusal of care/lab draws.  Interviewed and examined patient along with his RN in room.  Has arms crossed around across his chest, appears to be awake but eyes closed, does not say anything, does not follow instructions but occasionally nods.  Objective:   Vitals:   05/15/19 1041 05/15/19 1530 05/15/19 2000 05/16/19 0400  BP: 98/69 126/77 113/66 129/76  Pulse: 78 75 88 89  Resp: (!) 27 (!) 26 20 20   Temp: 97.6 F (36.4 C) 97.6 F (36.4 C) 97.9 F (36.6 C) 98 F (36.7 C)  TempSrc: Axillary Axillary Axillary Axillary  SpO2: 94% 93% 100% 94%  Height:        General exam: Elderly male, moderately built, thinly nourished, chronically ill looking, lying comfortably  supine in bed without distress. Respiratory system: Clear to auscultation/poor inspiratory effort. Respiratory effort normal. Cardiovascular system: S1 & S2 heard, RRR. No JVD, murmurs, rubs, gallops or clicks. No pedal edema.  Telemetry personally reviewed: Sinus rhythm Gastrointestinal system: Abdomen is nondistended, soft and nontender. No organomegaly or masses felt. Normal bowel sounds heard. Central nervous system: Mental status as noted above. No focal neurological deficits. Extremities: Symmetric 5 x 5 power. Skin: No rashes, lesions or ulcers Psychiatry: Judgement and insight impaired. Mood & affect cannot be assessed at this time.     Data Reviewed:   I have personally reviewed following labs and imaging studies   CBC: Recent Labs  Lab 04/29/2019 1543 05/20/2019 1556 05/15/19 0029  WBC 8.7  --  9.2  NEUTROABS 7.4  --   --   HGB 17.8* 18.4* 14.0  HCT 56.2* 54.0* 44.0  MCV 90.9  --  89.8  PLT 305  --  250    Basic Metabolic Panel: Recent Labs  Lab 05/04/2019 1543 05/02/2019 1556 05/15/19 0029  NA 151* 152* 154*  K 5.4* 5.3* 5.1  CL 112*  --  123*  CO2 17*  --  18*  GLUCOSE 144*  --  118*  BUN 117*  --  107*  CREATININE 3.93*  --  3.38*  CALCIUM 9.5  --  8.0*    Liver Function Tests: Recent  Labs  Lab 04/30/2019 1543  AST 30  ALT 23  ALKPHOS 71  BILITOT 1.3*  PROT 8.4*  ALBUMIN 3.1*    CBG: Recent Labs  Lab 05/16/19 0427 05/16/19 0909 05/16/19 1231  GLUCAP 95 141* 107*    Microbiology Studies:   Recent Results (from the past 240 hour(s))  SARS CORONAVIRUS 2 (TAT 6-24 HRS) Nasopharyngeal Nasopharyngeal Swab     Status: Abnormal   Collection Time: 05/23/2019  4:47 PM   Specimen: Nasopharyngeal Swab  Result Value Ref Range Status   SARS Coronavirus 2 POSITIVE (A) NEGATIVE Final    Comment: RESULT CALLED TO, READ BACK BY AND VERIFIED WITH: N. Kerry Fort 3716 05/15/2019 T. TYSOR (NOTE) SARS-CoV-2 target nucleic acids are DETECTED. The SARS-CoV-2 RNA  is generally detectable in upper and lower respiratory specimens during the acute phase of infection. Positive results are indicative of the presence of SARS-CoV-2 RNA. Clinical correlation with patient history and other diagnostic information is  necessary to determine patient infection status. Positive results do not rule out bacterial infection or co-infection with other viruses.  The expected result is Negative. Fact Sheet for Patients: HairSlick.no Fact Sheet for Healthcare Providers: quierodirigir.com This test is not yet approved or cleared by the [place] FDA and  has been authorized for detection and/or diagnosis of SARS-CoV-2 by FDA under an Emergency Use Authorization (EUA). This EUA will remain  in effect (meaning this test can be used) for  the duration of the COVID-19 declaration under Section 564(b)(1) of the Act, 21 U.S.C. section 360bbb-3(b)(1), unless the authorization is terminated or revoked sooner. Performed at [facility], [address], [place], [place] [address]      Radiology Studies:  No results found.   Scheduled Meds:   . atorvastatin  20 mg Oral Daily  . dexamethasone (DECADRON) injection  6 mg Intravenous Q24H  . dorzolamide-timolol  1 drop Right Eye BID  . insulin aspart  0-9 Units Subcutaneous Q4H    Continuous Infusions:   . remdesivir 100 mg in NS 100 mL 100 mg (05/16/19 0913)  . sodium chloride       LOS: 2 days     Marcellus Scott, MD, [place], [facility]. [facility]    To contact the attending provider between 7A-7P or the covering provider during after hours 7P-7A, please log into the web site [email or web address] and access using universal [place] password for that web site. If you do not have the password, please call the hospital operator.  05/16/2019, 1:38 PM

## 2019-05-16 NOTE — Progress Notes (Signed)
Pt has been uncooperative during night shift, refusing to answer any questions with more than a grunt and repeating "I want to go to sleep. Leave me alone." When interventions or assessments are attempted, pt constricts body and pulls away from contact, going as far as to push away the healthcare worker. It is unclear whether the pt is oriented due to pt refusal to answer questions.   Phlebotomy attempted to draw labs, but pt again pulled back. They are to return later to attempt again, however, it is unlikely that pt disposition will change.   Pt demeanor and concerns in regard to orientation will be passed onto day shift to discuss with MD. Will continue to monitor in the meantime.

## 2019-05-16 NOTE — Evaluation (Signed)
Clinical/Bedside Swallow Evaluation Patient Details  Name: Kamir Selover MRN: [id number] Date of Birth: 1926/11/07  Today's Date: 05/16/2019 Time: SLP Start Time (ACUTE ONLY): 1250 SLP Stop Time (ACUTE ONLY): 1300 SLP Time Calculation (min) (ACUTE ONLY): 10 min  Past Medical History:  Past Medical History:  Diagnosis Date  . Dementia (HCC)   . Essential hypertension   . Hyperlipidemia    Past Surgical History:  Past Surgical History:  Procedure Laterality Date  . BURR HOLE Bilateral 05/28/2016   Procedure: BURR HOLES FOR SUBDURAL HEMATOMA;  Surgeon: Donalee Citrin, MD;  Location: [facility] OR;  Service: Neurosurgery;  Laterality: Bilateral;  . BURR HOLE FOR SUBDURAL HEMATOMA Bilateral    HPI:  Patient is 86 year old male admitted with AMS and aphasia. MRI was negative; pt tested positive for COVID-19. PMH: HTN, HLD, SDH s/p burr hole (2018), dementia. Of note, pt's wife recently passed away.    Assessment / Plan / Recommendation Clinical Impression  Pt did not accept any POs offered despite Max cues from SLP. He is alert but does not follow my commands. His voice sounded clear, but he does not verbalize for me. SLP tried to present POs via different delivery methods and tried to engage pt in self-feeding but he would consistently nonverbally decline anything offered by turning his head away or pulling the blanket over his mouth. Unfortunately I cannot make any recommendations for a safe PO diet without seeing him swallow anything. MD could consider allowing staff to offer him small sips of water after oral care to let him attempt swallowing - using his musculature and adding some moisture. I would not use other thin liquids, as there is risk for aspiration given unknown swallow function. SLP will continue to follow for diagnostic PO trials. SLP Visit Diagnosis: Dysphagia, unspecified (R13.10)    Aspiration Risk  Moderate aspiration risk;Risk for inadequate nutrition/hydration    Diet Recommendation  (defer to MD - could consider allowing a few sips of water)   Medication Administration: Via alternative means    Other  Recommendations Oral Care Recommendations: Oral care QID Other Recommendations: Have oral suction available   Follow up Recommendations Skilled Nursing facility      Frequency and Duration min 2x/week  2 weeks       Prognosis Prognosis for Safe Diet Advancement: Fair Barriers to Reach Goals: Cognitive deficits      Swallow Study   General HPI: Patient is 86 year old male admitted with AMS and aphasia. MRI was negative; pt tested positive for COVID-19. PMH: HTN, HLD, SDH s/p burr hole (2018), dementia. Of note, pt's wife recently passed away.  Type of Study: Bedside Swallow Evaluation Previous Swallow Assessment: none in chart Diet Prior to this Study: NPO Temperature Spikes Noted: No Respiratory Status: Room air History of Recent Intubation: No Behavior/Cognition: Alert;Doesn't follow directions Oral Care Completed by SLP: No Patient Positioning: Other (comment)(pt in upright sidelying) Baseline Vocal Quality: Normal    Oral/Motor/Sensory Function     Ice Chips Ice chips: Not tested   Thin Liquid Thin Liquid: Impaired Presentation: Spoon;Straw Oral Phase Impairments: Other (comment)(no acceptance)    Nectar Thick Nectar Thick Liquid: Not tested   Honey Thick Honey Thick Liquid: Not tested   Puree Puree: Impaired Presentation: Spoon Oral Phase Impairments: Other (comment)(no acceptance)   Solid     Solid: Not tested       Mahala Menghini., M.A. CCC-SLP Acute Rehabilitation Services Pager [phone] Office [phone]  05/16/2019,2:23 PM

## 2019-05-17 LAB — GLUCOSE, CAPILLARY
Glucose-Capillary: 100 mg/dL — ABNORMAL HIGH (ref 70–99)
Glucose-Capillary: 111 mg/dL — ABNORMAL HIGH (ref 70–99)
Glucose-Capillary: 120 mg/dL — ABNORMAL HIGH (ref 70–99)
Glucose-Capillary: 154 mg/dL — ABNORMAL HIGH (ref 70–99)
Glucose-Capillary: 91 mg/dL (ref 70–99)

## 2019-05-17 NOTE — Progress Notes (Signed)
PROGRESS NOTE   Henry Harvey  ERD:[id number]    DOB: June 15, 1926    DOA: 05/25/2019  PCP: Dorothyann Peng, MD   I have briefly reviewed patients previous medical records in [facility]   Brief Narrative:  86 year old recently widowed male, PMH of HTN, HLD, prior subdural hematoma s/p bur hole 2018, suspected dementia, recently lost his wife, last known normal was day prior to admission, presented with aphasia and altered mental status.  At baseline reportedly alert, oriented, active, able to conduct all activities of daily living and take care of himself.  Admitted for aphasia, acute metabolic encephalopathy, dehydration with hypernatremia, hyperkalemia, acute on chronic kidney disease and COVID-19 positive.   Assessment & Plan:  Principal Problem:   Acute metabolic encephalopathy Active Problems:   Hypertension   Hyperlipidemia   Elevated troponin   Hyperkalemia   Aphasia   High anion gap metabolic acidosis   COVID-19 virus infection   Acute kidney injury superimposed on CKD (HCC)   Dehydration with hypernatremia   Aphasia Unclear etiology.  Suspect all metabolic complicating underlying dementia.  CT head without acute findings.  MRI brain negative for acute stroke.   Although awake, keeps eyes closed, refuses to speak, nods occasionally. Continues to refuse labs and care.  Acute metabolic encephalopathy  Most likely related to dehydration with hypernatremia, acute on chronic kidney disease in the context of COVID-19 infection complicating underlying dementia.  CT head and MRI brain without acute findings.  Chest x-ray without acute disease.  B12: 7231.  TSH normal/0.594.  Urine microscopy without UTI features.  UDS negative.  Treat underlying cause and monitor.  No consistent improvement in mental status since admission.  He may have underlying grief reaction from recent spouse's demise but this needs to be reassessed after correcting metabolic abnormalities.  Underlying  dementia might be playing a role.  -Patient continued to refuse labs.  -Palliative consult to discuss goals of care.  Acute kidney injury complicating stage IIIa chronic kidney disease.    Baseline creatinine possibly in the 1.2-1.5 range.  Presented with creatinine of 3.93.  Renal ultrasound shows bilateral renal atrophy without hydronephrosis.  Avoid nephrotoxins.  Not on ACEI/ARB or diuretics PTA.  Hydrate aggressively with IV fluids and follow BMP daily.  Patient has repeatedly refused lab draws this morning and then again midmorning.  Discussed with nursing and to get labs drawn when patient cooperative.  Unfortunately not having labs limits our ability to appropriately care for him.    Hyperkalemia  Secondary to acute kidney injury.  Presented with potassium of 5.4 which has improved to 5.1.  Follow BMP daily, please see discussion above, patient refusing labs.  Anion gap metabolic acidosis  Secondary to acute kidney injury.  Presented with bicarbonate of 17, anion gap of 22.  Treat acute kidney injury and follow BMP, please see discussion above, patient refusing labs.  Dehydration with hypernatremia  Sodium has increased from 151 on admission to 154.  Change IV fluids to D5 infusion and follow BMP, patient refusing labs.  Acute respiratory failure with hypoxia COVID-19 infection  It is possible that this precipitated poor oral intake and associated acute kidney injury with complications.  Trend inflammatory markers.    IV remdesivir and IV Decadron.  SpO2: 95 % O2 Flow Rate (L/min): 2 L/min  Dysphagia  Suspect mostly due to acute metabolic encephalopathy rather than stroke.  Patient failed bedside RN stroke swallow screen.  ST consulted for swallow evaluation and they are recommending dysphagia 3  diet.  Essential hypertension  Normotensive.  Hold antihypertensive/amlodipine.  Hyperlipidemia  Statins when able.  DM2 with renal complications  A1c 6.6.  NovoLog SSI.   If CBGs markedly elevated from D5 infusion or steroids, will consider initiating Levemir.  CBGs reasonably controlled.  Hyperlipidemia  Continue statins.  Elevated troponin  No anginal symptoms reported.  EKG without acute changes.  Low level and flat trend.  Low index of suspicion for ACS.  Body mass index is 19.77 kg/m./Severe protein calorie malnutrition  Consult dietitian after swallow evaluation.  Adult failure to thrive  Multifactorial due to acute illness as noted above complicating underlying dementia and recent loss of spouse.  PMT consulted for goals of care.  Unsure if psychiatry will be able to contribute much since patient not willing to participate and has acute encephalopathy.   DVT prophylaxis: SCDs Code Status: DNR Family Communication: None at bedside.  I discussed with patient's daughter in detail via phone, updated care and answered questions.  Disposition: To be determined pending clinical improvement.  May take several days.  Social worker for SNF placement.  Consultants:   Palliative care.  Procedures:   None  Antimicrobials:   None   Subjective:  Patient was lying down with eyes closed, occasionally nodding.  Discussed about the importance of labs and medical care.  Patient did noded his head but I am not sure whether he is really getting it or not.  Objective:   Vitals:   05/16/19 0400 05/16/19 1419 05/16/19 2048 05/17/19 1301  BP: 129/76 125/65 103/71 115/83  Pulse: 89   86  Resp: 20 20 20 18   Temp: 98 F (36.7 C) 98 F (36.7 C) (!) 97.4 F (36.3 C) (!) 97.5 F (36.4 C)  TempSrc: Axillary Axillary Axillary Axillary  SpO2: 94% 94%  95%  Height:        General exam: Elderly male, moderately built, thinly nourished, chronically ill looking, lying comfortably, in no acute distress. Respiratory system: Clear to auscultation/poor inspiratory effort. Respiratory effort normal. Cardiovascular system: S1 & S2 heard, RRR. No JVD, murmurs, rubs,  gallops or clicks. No pedal edema.  Telemetry personally reviewed: Sinus rhythm Gastrointestinal system: Abdomen is nondistended, soft and nontender. Normal bowel sounds heard. Central nervous system: Mental status as noted above. No focal neurological deficits. Extremities: Symmetric 5 x 5 power. Skin: No rashes, lesions or ulcers Psychiatry: Judgement and insight impaired.    Data Reviewed:   I have personally reviewed following labs and imaging studies   CBC: Recent Labs  Lab 05/29/2019 1543 05/26/2019 1556 05/15/19 0029  WBC 8.7  --  9.2  NEUTROABS 7.4  --   --   HGB 17.8* 18.4* 14.0  HCT 56.2* 54.0* 44.0  MCV 90.9  --  89.8  PLT 305  --  235    Basic Metabolic Panel: Recent Labs  Lab 05/10/2019 1543 05/01/2019 1556 05/15/19 0029  NA 151* 152* 154*  K 5.4* 5.3* 5.1  CL 112*  --  123*  CO2 17*  --  18*  GLUCOSE 144*  --  118*  BUN 117*  --  107*  CREATININE 3.93*  --  3.38*  CALCIUM 9.5  --  8.0*    Liver Function Tests: Recent Labs  Lab  1543  AST 30  ALT 23  ALKPHOS 71  BILITOT 1.3*  PROT 8.4*  ALBUMIN 3.1*    CBG: Recent Labs  Lab 05/17/19 0829 05/17/19 1152 05/17/19 1523  GLUCAP 91 100* 111*  Microbiology Studies:   Recent Results (from the past 240 hour(s))  SARS CORONAVIRUS 2 (TAT 6-24 HRS) Nasopharyngeal Nasopharyngeal Swab     Status: Abnormal   Collection Time: May 16, 2019  4:47 PM   Specimen: Nasopharyngeal Swab  Result Value Ref Range Status   SARS Coronavirus 2 POSITIVE (A) NEGATIVE Final    Comment: RESULT CALLED TO, READ BACK BY AND VERIFIED WITH: N. Arcelia Jew 1610 05/15/2019 T. TYSOR (NOTE) SARS-CoV-2 target nucleic acids are DETECTED. The SARS-CoV-2 RNA is generally detectable in upper and lower respiratory specimens during the acute phase of infection. Positive results are indicative of the presence of SARS-CoV-2 RNA. Clinical correlation with patient history and other diagnostic information is  necessary to determine  patient infection status. Positive results do not rule out bacterial infection or co-infection with other viruses.  The expected result is Negative. Fact Sheet for Patients: SugarRoll.be Fact Sheet for Healthcare Providers: https://www.woods-mathews.com/ This test is not yet approved or cleared by the [place] FDA and  has been authorized for detection and/or diagnosis of SARS-CoV-2 by FDA under an Emergency Use Authorization (EUA). This EUA will remain  in effect (meaning this test can be used) for  the duration of the COVID-19 declaration under Section 564(b)(1) of the Act, 21 U.S.C. section 360bbb-3(b)(1), unless the authorization is terminated or revoked sooner. Performed at [facility], [place] [address] [place], [place] [address]      Radiology Studies:  No results found.   Scheduled Meds:   . atorvastatin  20 mg Oral Daily  . chlorhexidine  15 mL Mouth Rinse BID  . dexamethasone (DECADRON) injection  6 mg Intravenous Q24H  . dorzolamide-timolol  1 drop Right Eye BID  . insulin aspart  0-9 Units Subcutaneous Q4H  . mouth rinse  15 mL Mouth Rinse q12n4p    Continuous Infusions:   . remdesivir 100 mg in NS 100 mL 100 mg (05/17/19 1243)     LOS: 3 days     Lorella Nimrod, MD,  [facility]    To contact the attending provider between 7A-7P or the covering provider during after hours 7P-7A, please log into the web site [email or web address] and access using universal [place] password for that web site. If you do not have the password, please call the hospital operator.  05/17/2019, 5:48 PM

## 2019-05-17 NOTE — NC FL2 (Signed)
[place] LEVEL OF CARE SCREENING TOOL     IDENTIFICATION  Patient Name: Henry Harvey Birthdate: 1926/06/09 Sex: male Admission Date (Current Location): 05/12/2019  [facility] and [place] Number:  Herbalist and Address:  [facility], [place] [address] [place],  [address]      Provider Number: [id number]  Attending Physician Name and Address:  Lorella Nimrod, MD  Relative Name and Phone Number:  [name] [id number],[phone]    Current Level of Care: Hospital Recommended Level of Care: [place] Prior Approval Number:    Date Approved/Denied:   PASRR Number:    Discharge Plan: SNF    Current Diagnoses: Patient Active Problem List   Diagnosis Date Noted  . Aphasia 05/15/2019  . Acute metabolic encephalopathy 17/51/0258  . High anion gap metabolic acidosis 52/77/8242  . COVID-19 virus infection 05/15/2019  . Acute kidney injury superimposed on CKD ([place]) 05/15/2019  . Dehydration with hypernatremia 05/15/2019  . AKI (acute kidney injury) ([place]) 05/20/2019  . Altered mental status 05/18/2019  . Elevated troponin 05/28/2019  . Hyperkalemia 05/29/2019  . Hypernatremia 05/20/2019  . Glaucoma, right eye   . Hypertension   . Hyperlipidemia   . Syncope   . Dementia without behavioral disturbance ([place])   . Hyperglycemia   . SDH (subdural hematoma) ([place]) 05/28/2016  . Subdural hematoma (H[facility]) 05/28/2016    Orientation RESPIRATION BLADDER Height & Weight     Self  Normal External catheter, Incontinent Weight:   Height:  5' 1.8" (157 cm)  BEHAVIORAL SYMPTOMS/MOOD NEUROLOGICAL BOWEL NUTRITION STATUS      Continent Diet(See discharge summary)  AMBULATORY STATUS COMMUNICATION OF NEEDS Skin   Extensive Assist Verbally Normal                       Personal Care Assistance Level of Assistance  Bathing, Feeding, Dressing Bathing Assistance: Maximum assistance Feeding assistance: Limited assistance Dressing  Assistance: Maximum assistance     Functional Limitations Info  Sight, Hearing, Speech Sight Info: Adequate Hearing Info: Adequate Speech Info: Adequate    SPECIAL CARE FACTORS FREQUENCY  PT (By licensed PT), OT (By licensed OT)     PT Frequency: 5x week OT Frequency: 5x week            Contractures Contractures Info: Not present    Additional Factors Info  Code Status, Allergies Code Status Info: DNR Allergies Info: Penicillins           Current Medications (05/17/2019):  This is the current hospital active medication list Current Facility-Administered Medications  Medication Dose Route Frequency Provider Last Rate Last Admin  . atorvastatin (LIPITOR) tablet 20 mg  20 mg Oral Daily Nicolette Bang, DO      . chlorhexidine (PERIDEX) 0.12 % solution 15 mL  15 mL Mouth Rinse BID Modena Jansky, MD   15 mL at 05/17/19 0955  . dexamethasone (DECADRON) injection 6 mg  6 mg Intravenous Q24H Hongalgi, Lenis Dickinson, MD   6 mg at 05/17/19 1254  . dorzolamide-timolol (COSOPT) 22.3-6.8 MG/ML ophthalmic solution 1 drop  1 drop Right Eye BID Nicolette Bang, DO   1 drop at 05/17/19 0955  . insulin aspart (novoLOG) injection 0-9 Units  0-9 Units Subcutaneous Q4H Modena Jansky, MD   2 Units at 05/17/19 0053  . MEDLINE mouth rinse  15 mL Mouth Rinse q12n4p Hongalgi, Anand D, MD   15 mL at 05/17/19 1200  .  remdesivir 100 mg in sodium chloride 0.9 % 100 mL IVPB  100 mg Intravenous Daily Marcellus Scott D, MD 200 mL/hr at 05/17/19 1243 100 mg at 05/17/19 1243     Discharge Medications: Please see discharge summary for a list of discharge medications.  Relevant Imaging Results:  Relevant Lab Results:   Additional Information SS# [id number]  Terrilee Croak, Student-Social Work

## 2019-05-17 NOTE — Progress Notes (Signed)
  Speech Language Pathology Treatment: Dysphagia  Patient Details Name: Henry Harvey MRN: [id number] DOB: [birth year] Today's Date: 05/17/2019 Time: 4696-2952 SLP Time Calculation (min) (ACUTE ONLY): 41 min  Assessment / Plan / Recommendation Clinical Impression  Pt much more alert and participatory today. He was oriented to time (month/year), disoriented to place [facility]), and when told he had COVID-19 he was demonstrably upset but redirectable.  MRI was negative for acute event. Pt state he was hungry - despite loose-fitting dentures, he actively masticated, demonstrated the appearance of a brisk swallow, and consumed thin liquids as well as mixed solid/liquid consistencies with no concern for aspiration.  He needed assist with set-up of utensils then was able to feed himself.  Dysphagia 3, thin liquids ordered.  Pt listened to choices and made selections for a late lunch and dinner.  He spoke with his daughter, Henry Harvey, on the phone.  Recommend assisting pt with tray set-up and helping with first meal to ensure success.  No further SLP f/u is needed.  Our service will sign off.    HPI HPI: Patient is 86 year old male admitted with AMS and aphasia. MRI was negative; pt tested positive for COVID-19. PMH: HTN, HLD, SDH s/p burr hole (2018), dementia. Of note, pt's wife recently passed away.       SLP Plan  All goals met       Recommendations  Diet recommendations: Dysphagia 3 (mechanical soft);Thin liquid Liquids provided via: Cup;Straw Medication Administration: Whole meds with puree Supervision: Patient able to self feed                Oral Care Recommendations: Oral care BID Follow up Recommendations: 24 hour supervision/assistance SLP Visit Diagnosis: Dysphagia, unspecified (R13.10) Plan: All goals met       GO                Henry Harvey 05/17/2019, 3:08 PM   L. Tivis Ringer, [place] Office number [phone] Pager  [phone]

## 2019-05-17 NOTE — TOC Initial Note (Signed)
Transition of Care [facility]) - Initial/Assessment Note    Patient Details  Name: Henry Harvey MRN: [id number] Date of Birth: January 07, 1927  Transition of Care [facility]) CM/SW Contact:    Kirstie Peri, [place] Work Phone Number: 05/17/2019, 3:37 PM  Clinical Narrative:                  MSW Intern contacted Pt daughter to discuss goals of care. Daughter was on board for SNF placement and requested [facility] as her first choice and [facility] as her second. She noted that at baseline, pt lives alone, is independent, and occasionally uses a cane.  Daughter requested that SW contact her father to gauge his reaction. MSW intern contacted nurse to assist with the phone call. Pt was alert and cooperative. He seemed to understand the conversation and agreed to SNF placement. He asked me to tell his daughter he is ok. MSW Intern called daughter to let her know that he had agreed and that his information will be sent out to her choices. SW will continue to follow. Expected Discharge Plan: Skilled Nursing Facility Barriers to Discharge: Continued Medical Work up   Patient Goals and CMS Choice Patient states their goals for this hospitalization and ongoing recovery are:: Patient states they are ok with going to rehab before going home. CMS Medicare.gov Compare Post Acute Care list provided to:: Patient Choice offered to / list presented to : Patient  Expected Discharge Plan and Services Expected Discharge Plan: [place]       Living arrangements for the past 2 months: Single Family Home                                      Prior Living Arrangements/Services Living arrangements for the past 2 months: Single Family Home Lives with:: Self Patient language and need for interpreter reviewed:: Yes Do you feel safe going back to the place where you live?: Yes      Need for Family Participation in Patient Care: Yes (Comment) Care giver support system in place?: Yes  (comment) Current home services: DME Criminal Activity/Legal Involvement Pertinent to Current Situation/Hospitalization: No - Comment as needed  Activities of Daily Living Home Assistive Devices/Equipment: Other (Comment) ADL Screening (condition at time of admission) Patient's cognitive ability adequate to safely complete daily activities?: No Is the patient deaf or have difficulty hearing?: Yes Does the patient have difficulty seeing, even when wearing glasses/contacts?: No Does the patient have difficulty concentrating, remembering, or making decisions?: No Patient able to express need for assistance with ADLs?: Yes Does the patient have difficulty dressing or bathing?: No Independently performs ADLs?: No Does the patient have difficulty walking or climbing stairs?: Yes Weakness of Legs: Both Weakness of Arms/Hands: Both  Permission Sought/Granted Permission sought to share information with : Facility Art therapist granted to share information with : Yes, Verbal Permission Granted  Share Information with NAME: [name]     Permission granted to share info w Relationship: Daughter  Permission granted to share info w Contact Information: [phone]  Emotional Assessment Appearance:: Other (Comment Required(Unable to assess) Attitude/Demeanor/Rapport: Engaged Affect (typically observed): Unable to Assess Orientation: : Oriented to Self Alcohol / Substance Use: Not Applicable Psych Involvement: No (comment)  Admission diagnosis:  Uremia [N19] AKI (acute kidney injury) ([place]) [N17.9] Altered mental status, unspecified altered mental status type [R41.82] Patient Active Problem List   Diagnosis Date Noted  .  Aphasia 05/15/2019  . Acute metabolic encephalopathy 05/15/2019  . High anion gap metabolic acidosis 05/15/2019  . COVID-19 virus infection 05/15/2019  . Acute kidney injury superimposed on CKD (H[facility]) 05/15/2019  . Dehydration with hypernatremia 05/15/2019   . AKI (acute kidney injury) ([facility]) 05/17/2019  . Altered mental status 05/11/2019  . Elevated troponin 05/16/2019  . Hyperkalemia 05/01/2019  . Hypernatremia 05/05/2019  . Glaucoma, right eye   . Hypertension   . Hyperlipidemia   . Syncope   . Dementia without behavioral disturbance (H[facility])   . Hyperglycemia   . SDH (subdural hematoma) (H[facility]) 05/28/2016  . Subdural hematoma ([facility]) 05/28/2016   PCP:  Dorothyann Peng, MD Pharmacy:   [facility] [phone] - [place], [place] - [address] AT [facility] [address]  [place] [address] Phone: [phone] Fax: [phone]     Social Determinants of Health (SDOH) Interventions    Readmission Risk Interventions No flowsheet data found.

## 2019-05-17 NOTE — Progress Notes (Addendum)
Two nurse attempted to start an iv site, unable to get an iv site. IV team  was consulted for iv placement.  Patient continues to refuse daily care and neuro checks, pt able to respond to his name but is unable to follow commands. Will continue to monitor.

## 2019-05-18 DIAGNOSIS — G9341 Metabolic encephalopathy: Secondary | ICD-10-CM

## 2019-05-18 DIAGNOSIS — Z515 Encounter for palliative care: Secondary | ICD-10-CM

## 2019-05-18 DIAGNOSIS — R630 Anorexia: Secondary | ICD-10-CM

## 2019-05-18 DIAGNOSIS — Z7189 Other specified counseling: Secondary | ICD-10-CM

## 2019-05-18 DIAGNOSIS — U071 COVID-19: Principal | ICD-10-CM

## 2019-05-18 LAB — C-REACTIVE PROTEIN: CRP: 3.5 mg/dL — ABNORMAL HIGH (ref <1.0)

## 2019-05-18 LAB — CBC WITH DIFFERENTIAL/PLATELET
Abs Immature Granulocytes: 0.09 10*3/uL — ABNORMAL HIGH (ref 0.00–0.07)
Basophils Absolute: 0 10*3/uL (ref 0.0–0.1)
Basophils Relative: 0 %
Eosinophils Absolute: 0 10*3/uL (ref 0.0–0.5)
Eosinophils Relative: 0 %
HCT: 46.3 % (ref 39.0–52.0)
Hemoglobin: 15 g/dL (ref 13.0–17.0)
Immature Granulocytes: 1 %
Lymphocytes Relative: 5 %
Lymphs Abs: 0.5 10*3/uL — ABNORMAL LOW (ref 0.7–4.0)
MCH: 28.8 pg (ref 26.0–34.0)
MCHC: 32.4 g/dL (ref 30.0–36.0)
MCV: 89 fL (ref 80.0–100.0)
Monocytes Absolute: 0.4 10*3/uL (ref 0.1–1.0)
Monocytes Relative: 4 %
Neutro Abs: 9.9 10*3/uL — ABNORMAL HIGH (ref 1.7–7.7)
Neutrophils Relative %: 90 %
Platelets: 252 10*3/uL (ref 150–400)
RBC: 5.2 MIL/uL (ref 4.22–5.81)
RDW: 14.8 % (ref 11.5–15.5)
WBC: 10.9 10*3/uL — ABNORMAL HIGH (ref 4.0–10.5)
nRBC: 0 % (ref 0.0–0.2)

## 2019-05-18 LAB — GLUCOSE, CAPILLARY
Glucose-Capillary: 106 mg/dL — ABNORMAL HIGH (ref 70–99)
Glucose-Capillary: 111 mg/dL — ABNORMAL HIGH (ref 70–99)
Glucose-Capillary: 122 mg/dL — ABNORMAL HIGH (ref 70–99)
Glucose-Capillary: 123 mg/dL — ABNORMAL HIGH (ref 70–99)
Glucose-Capillary: 87 mg/dL (ref 70–99)

## 2019-05-18 LAB — FERRITIN: Ferritin: 1056 ng/mL — ABNORMAL HIGH (ref 24–336)

## 2019-05-18 LAB — D-DIMER, QUANTITATIVE: D-Dimer, Quant: 8.46 ug/mL-FEU — ABNORMAL HIGH (ref 0.00–0.50)

## 2019-05-18 MED ORDER — OLANZAPINE 5 MG PO TBDP
2.5000 mg | ORAL_TABLET | Freq: Every day | ORAL | Status: DC
  Administered 2019-05-18 – 2019-05-20 (×2): 2.5 mg via ORAL
  Filled 2019-05-18 (×5): qty 0.5

## 2019-05-18 MED ORDER — ENSURE ENLIVE PO LIQD
237.0000 mL | Freq: Three times a day (TID) | ORAL | Status: DC
  Administered 2019-05-18 – 2019-05-21 (×9): 237 mL via ORAL
  Filled 2019-05-18: qty 237

## 2019-05-18 NOTE — Progress Notes (Signed)
Physical Therapy Treatment Patient Details Name: Henry Harvey MRN: [id number] DOB: 1926/09/25 Today's Date: 05/18/2019    History of Present Illness Patient is 86 year old male presenting with lack of speech LSN 6pm 1/16. Daughter at bedside states his baseline is able to conduct all ADLs and no altered mental status and able to speak fluently.  Patient is neuro intact on my exam and follows commands well and shakes his head yes and no to questions asked.  Admitted with AMS and AKI with electrolyte disturbance; CT neg; tested pos for covid    PT Comments    Pt very pleasant, resting in bed on arrival. Decreased initiation with bed mobility, requiring max assist supine to sit. After sitting up, pt more engaged. Remained minimally verbal but better active participation in mobility. +2 mod assist required for sit to stand and amb 3' +2 HHA bed to recliner.  After positioning in recliner with feet elevated, pt requesting a straw to drink his water.   Follow Up Recommendations  SNF;Supervision/Assistance - 24 hour     Equipment Recommendations  Rolling walker with 5" wheels;3in1 (PT)    Recommendations for Other Services       Precautions / Restrictions Precautions Precautions: Fall    Mobility  Bed Mobility Overal bed mobility: Needs Assistance Bed Mobility: Supine to Sit     Supine to sit: Max assist     General bed mobility comments: decreased initiation  Transfers Overall transfer level: Needs assistance Equipment used: 2 person hand held assist Transfers: Sit to/from Stand;Stand Pivot Transfers Sit to Stand: +2 physical assistance;Mod assist Stand pivot transfers: +2 physical assistance;Mod assist       General transfer comment: assist to initiate mobility and power up.  Ambulation/Gait Ambulation/Gait assistance: Mod assist;+2 physical assistance Gait Distance (Feet): 3 Feet Assistive device: 2 person hand held assist Gait Pattern/deviations: Step-through  pattern;Decreased stride length     General Gait Details: +2 HH/mod assist 3' bed to recliner. Attempted 2nd trial of gait after sitting in recliner but pt declining/resisting. Pt, therefore, positioned in recliner.   Stairs             Wheelchair Mobility    Modified Rankin (Stroke Patients Only)       Balance Overall balance assessment: Needs assistance Sitting-balance support: Feet supported;No upper extremity supported Sitting balance-Leahy Scale: Fair     Standing balance support: Bilateral upper extremity supported;During functional activity Standing balance-Leahy Scale: Poor Standing balance comment: reliant on external support                            Cognition Arousal/Alertness: Awake/alert Behavior During Therapy: WFL for tasks assessed/performed Overall Cognitive Status: Impaired/Different from baseline Area of Impairment: Orientation;Memory;Safety/judgement;Awareness;Following commands                 Orientation Level: Disoriented to;Situation;Time   Memory: Decreased short-term memory Following Commands: Follows one step commands with increased time Safety/Judgement: Decreased awareness of safety;Decreased awareness of deficits Awareness: Intellectual          Exercises      General Comments General comments (skin integrity, edema, etc.): Pt on RA with SpO2 > 90%.      Pertinent Vitals/Pain Pain Assessment: Faces Faces Pain Scale: No hurt    Home Living                      Prior Function  PT Goals (current goals can now be found in the care plan section) Acute Rehab PT Goals Patient Stated Goal: not stated Progress towards PT goals: Progressing toward goals    Frequency    Min 3X/week      PT Plan Current plan remains appropriate    Co-evaluation PT/OT/SLP Co-Evaluation/Treatment: Yes Reason for Co-Treatment: For patient/therapist safety;To address functional/ADL  transfers;Necessary to address cognition/behavior during functional activity PT goals addressed during session: Mobility/safety with mobility;Balance        AM-PAC PT "6 Clicks" Mobility   Outcome Measure  Help needed turning from your back to your side while in a flat bed without using bedrails?: A Little Help needed moving from lying on your back to sitting on the side of a flat bed without using bedrails?: A Lot Help needed moving to and from a bed to a chair (including a wheelchair)?: A Lot Help needed standing up from a chair using your arms (e.g., wheelchair or bedside chair)?: A Lot Help needed to walk in hospital room?: A Lot Help needed climbing 3-5 steps with a railing? : A Lot 6 Click Score: 13    End of Session   Activity Tolerance: Patient tolerated treatment well Patient left: in chair;with call bell/phone within reach;with chair alarm set Nurse Communication: Mobility status PT Visit Diagnosis: Unsteadiness on feet (R26.81);Other abnormalities of gait and mobility (R26.89);Muscle weakness (generalized) (M62.81)     Time: 1610-9604 PT Time Calculation (min) (ACUTE ONLY): 24 min  Charges:  $Therapeutic Activity: 8-22 mins                     Aida Raider, PT  Office # [phone] Pager [phone]    Ilda Foil 05/18/2019, 12:55 PM

## 2019-05-18 NOTE — Progress Notes (Signed)
Nutrition Follow-up  RD working remotely.  DOCUMENTATION CODES:   Not applicable  INTERVENTION:   - Please obtain admission weight  - Ensure Enlive po TID, each supplement provides 350 kcal and 20 grams of protein  - Encourage adequate PO intake and provide feeding assistance as needed  NUTRITION DIAGNOSIS:   Inadequate oral intake related to lethargy/confusion as evidenced by NPO status.  Progressing, pt now on Dysphagia 3 diet  GOAL:   Patient will meet greater than or equal to 90% of their needs  Progressing, diet has been advanced  MONITOR:   PO intake, Supplement acceptance, Labs, Weight trends  REASON FOR ASSESSMENT:   Malnutrition Screening Tool    ASSESSMENT:   86 year old male who presented to the ED on 1/16 with aphasia and AMS. PMH of dementia, HTN, HLD, SDH. Pt found to be COVID-19 positive. Pt admitted with AKI.  01/19 - diet advanced to Dysphagia 3 with thin liquids  Per MD note yesterday, pt continuing to refuse labs and care. Palliative consulted to discuss GOC. Plan at this time is for pt to d/c to SNF.  No meal completions documented since diet advanced yesterday afternoon.  RD will order oral nutrition supplements to aid pt in meeting kcal and protein needs.  Medications reviewed and include: decadron, SSI q 4 hours, remdesivir  Labs reviewed: sodium 154, chloride 123, BUN 107, creatinine 3.38, ionized calcium 1.06 CBG's: 106-123 x 24 hours  Diet Order:   Diet Order            DIET DYS 3 Room service appropriate? Yes; Fluid consistency: Thin  Diet effective now              EDUCATION NEEDS:   Not appropriate for education at this time  Skin:  Skin Assessment: Reviewed RN Assessment  Last BM:  no documented BM  Height:   Ht Readings from Last 1 Encounters:  05/15/19 5' 1.8" (1.57 m)    Weight:   Wt Readings from Last 1 Encounters:  05/05/19 48.7 kg    Ideal Body Weight:  53.1 kg  BMI:  Body mass index is 19.77  kg/m.  Estimated Nutritional Needs:   Kcal:  1400-1600  Protein:  60-75 grams  Fluid:  1.4-1.6 L    Earma Reading, MS, RD, LDN Inpatient Clinical Dietitian Pager: [phone] Weekend/After Hours: [phone]

## 2019-05-18 NOTE — Progress Notes (Signed)
PROGRESS NOTE    Henry Harvey  EYC:[id number] DOB: May 27, 1926 DOA: 05/25/2019 PCP: Dorothyann Peng, MD   Brief Narrative:  86 year old recently widowed male, PMH of HTN, HLD, prior subdural hematoma s/p bur hole 2018, suspected dementia, recently lost his wife, last known normal was day prior to admission, presented with aphasia and altered mental status.  At baseline reportedly alert, oriented, active, able to conduct all activities of daily living and take care of himself.  Admitted for aphasia, acute metabolic encephalopathy, dehydration with hypernatremia, hyperkalemia, acute on chronic kidney disease and COVID-19 positive.  During the hospitalization patient was started on steroids and remdesivir.  He continued to have very poor oral intake, failure to thrive despite of encouragement.  He refused any lab work as well.  Daughter was aware, palliative care was consulted.   Assessment & Plan:   Principal Problem:   Acute metabolic encephalopathy Active Problems:   Hypertension   Hyperlipidemia   Elevated troponin   Hyperkalemia   Aphasia   High anion gap metabolic acidosis   COVID-19 virus infection   Acute kidney injury superimposed on CKD (HCC)   Dehydration with hypernatremia  Aphasia, intermittent Acute metabolic encephalopathy, intermittent -Suspect underlying metabolic issues versus infection.  CT head and MRI brain is negative -Neuro exam is nonfocal -TSH, B12 within normal limits.  UA-negative, UDS-negative  Acute kidney injury on CKD stage III A Hyperkalemia -Baseline creatinine 1.3.  Last creatinine 3.9.  Renal ultrasound-negative -Gentle hydration as needed -Patient is not allowing any lab work therefore unable to appropriately care for him. -Currently on normal levels of potassium as patient not allowing lab work again.  Multiple metabolic derangements -Hypernatremia, hyperkalemia, anion gap metabolic acidosis.  Difficult to assess as patient is refusing lab  work.  COVID-19 pneumonia -Oxygen levels-room air -Remdesivir-day 4 -Decadron-day 4 -Routine: Labs have been reviewed including ferritin, LDH, CRP, d-dimer, fibrinogen.  Will need to trend this lab daily. -Vitamin C & Zinc. Prone >16hrs/day.  -procalcitonin- -Chest x-ray- -Supportive care-antitussive, inhalers, I-S/flutter -CODE STATUS confirmed  Dysphagia -Speech and swallow recommending dysphagia 3 diet  Essential hypertension -Antihypertensives on hold  Hyperlipidemia -Statin  Diabetes mellitus type 2 -Continue current regimen.  Hyperlipidemia -Statin  Adult failure to thrive -Multifactorial complicated by ongoing issues and recent loss of spouse.  Palliative care medicine consulted to help establish goals of care  DVT prophylaxis: SCDs Code Status: DNR Family Communication: Spoke with patient's daughter Gunnar Fusi Disposition Plan: Awaiting completion of treatment with IV steroids and remdesivir.  PT recommending SNF but in the meantime palliative care to discuss goals of care with the family.  Consultants:   Palliative care  Procedures:     Antimicrobials:   None   Subjective: Awake this morning. Tells me he doesn't have any desire to eat right now. Only wants to liquid but not much.   Review of Systems Otherwise negative except as per HPI, including: General = no fevers, chills, dizziness, malaise, fatigue HEENT/EYES = negative for pain, redness, loss of vision, double vision, blurred vision, loss of hearing, sore throat, hoarseness, dysphagia Cardiovascular= negative for chest pain, palpitation, murmurs, lower extremity swelling Respiratory/lungs= negative for shortness of breath, cough, hemoptysis, wheezing, mucus production Gastrointestinal= negative for nausea, vomiting,, abdominal pain, melena, hematemesis Genitourinary= negative for Dysuria, Hematuria, Change in Urinary Frequency MSK = Negative for arthralgia, myalgias, Back Pain, Joint swelling   Neurology= Negative for headache, seizures, numbness, tingling  Psychiatry= Negative for anxiety, depression, suicidal and homocidal ideation Allergy/Immunology= Medication/Food allergy as listed  Skin= Negative for Rash, lesions, ulcers, itching  Objective: Vitals:   05/16/19 0400 05/16/19 1419 05/16/19 2048 05/17/19 1301  BP: 129/76 125/65 103/71 115/83  Pulse: 89   86  Resp: 20 20 20 18   Temp: 98 F (36.7 C) 98 F (36.7 C) (!) 97.4 F (36.3 C) (!) 97.5 F (36.4 C)  TempSrc: Axillary Axillary Axillary Axillary  SpO2: 94% 94%  95%  Height:        Intake/Output Summary (Last 24 hours) at 05/18/2019 1401 Last data filed at 05/17/2019 1800 Gross per 24 hour  Intake 220 ml  Output --  Net 220 ml   There were no vitals filed for this visit.  Examination:  Constitutional: Cachectic elderly frail-appearing Respiratory: Minimal bibasilar rhonchi Cardiovascular: Normal sinus rhythm, no rubs Abdomen: Nontender nondistended good bowel sounds Musculoskeletal: No edema noted Skin: No rashes seen Neurologic: CN 2-12 grossly intact.  And nonfocal Psychiatric: Poor judgment and insight.  Alert to name only.     Data Reviewed:   CBC: Recent Labs  Lab 23-May-2019 1543 May 23, 2019 1556 05/15/19 0029 05/18/19 0433  WBC 8.7  --  9.2 10.9*  NEUTROABS 7.4  --   --  9.9*  HGB 17.8* 18.4* 14.0 15.0  HCT 56.2* 54.0* 44.0 46.3  MCV 90.9  --  89.8 89.0  PLT 305  --  235 710   Basic Metabolic Panel: Recent Labs  Lab 05/23/19 1543 May 23, 2019 1556 05/15/19 0029  NA 151* 152* 154*  K 5.4* 5.3* 5.1  CL 112*  --  123*  CO2 17*  --  18*  GLUCOSE 144*  --  118*  BUN 117*  --  107*  CREATININE 3.93*  --  3.38*  CALCIUM 9.5  --  8.0*   GFR: Estimated Creatinine Clearance: 10 mL/min (A) (by C-G formula based on SCr of 3.38 mg/dL (H)). Liver Function Tests: Recent Labs  Lab 05-23-19 1543  AST 30  ALT 23  ALKPHOS 71  BILITOT 1.3*  PROT 8.4*  ALBUMIN 3.1*   No results for  input(s): LIPASE, AMYLASE in the last 168 hours. Recent Labs  Lab 05/15/19 0029  AMMONIA 29   Coagulation Profile: No results for input(s): INR, PROTIME in the last 168 hours. Cardiac Enzymes: Recent Labs  Lab 2019-05-23 1543  CKTOTAL 225   BNP (last 3 results) No results for input(s): PROBNP in the last 8760 hours. HbA1C: No results for input(s): HGBA1C in the last 72 hours. CBG: Recent Labs  Lab 05/17/19 1523 05/17/19 2009 05/18/19 0025 05/18/19 0759 05/18/19 1149  GLUCAP 111* 120* 123* 111* 106*   Lipid Profile: No results for input(s): CHOL, HDL, LDLCALC, TRIG, CHOLHDL, LDLDIRECT in the last 72 hours. Thyroid Function Tests: No results for input(s): TSH, T4TOTAL, FREET4, T3FREE, THYROIDAB in the last 72 hours. Anemia Panel: Recent Labs    05/18/19 0433  FERRITIN 1,056*   Sepsis Labs: Recent Labs  Lab 05-23-2019 2123 05/15/19 0029 05/15/19 0330  LATICACIDVEN 3.4* 1.8 2.1*    Recent Results (from the past 240 hour(s))  SARS CORONAVIRUS 2 (TAT 6-24 HRS) Nasopharyngeal Nasopharyngeal Swab     Status: Abnormal   Collection Time: 05/23/2019  4:47 PM   Specimen: Nasopharyngeal Swab  Result Value Ref Range Status   SARS Coronavirus 2 POSITIVE (A) NEGATIVE Final    Comment: RESULT CALLED TO, READ BACK BY AND VERIFIED WITH: N. CAGUIOA,RN 0218 05/15/2019 T. TYSOR (NOTE) SARS-CoV-2 target nucleic acids are DETECTED. The SARS-CoV-2 RNA is generally detectable in upper  and lower respiratory specimens during the acute phase of infection. Positive results are indicative of the presence of SARS-CoV-2 RNA. Clinical correlation with patient history and other diagnostic information is  necessary to determine patient infection status. Positive results do not rule out bacterial infection or co-infection with other viruses.  The expected result is Negative. Fact Sheet for Patients: HairSlick.no Fact Sheet for Healthcare  Providers: quierodirigir.com This test is not yet approved or cleared by the [place] FDA and  has been authorized for detection and/or diagnosis of SARS-CoV-2 by FDA under an Emergency Use Authorization (EUA). This EUA will remain  in effect (meaning this test can be used) for  the duration of the COVID-19 declaration under Section 564(b)(1) of the Act, 21 U.S.C. section 360bbb-3(b)(1), unless the authorization is terminated or revoked sooner. Performed at [facility], [address] [place], [place] [address]          Radiology Studies: No results found.      Scheduled Meds: . atorvastatin  20 mg Oral Daily  . chlorhexidine  15 mL Mouth Rinse BID  . dexamethasone (DECADRON) injection  6 mg Intravenous Q24H  . dorzolamide-timolol  1 drop Right Eye BID  . feeding supplement (ENSURE ENLIVE)  237 mL Oral TID BM  . insulin aspart  0-9 Units Subcutaneous Q4H  . mouth rinse  15 mL Mouth Rinse q12n4p   Continuous Infusions: . remdesivir 100 mg in NS 100 mL 100 mg (05/18/19 0954)     LOS: 4 days   Time spent= 35 mins    Tarena Gockley Joline Maxcy, MD [facility]  If 7PM-7AM, please contact night-coverage  05/18/2019, 2:01 PM

## 2019-05-18 NOTE — Consult Note (Signed)
Consultation Note Date: 05/18/2019   Patient Name: Henry Harvey  DOB: 1926-12-12  MRN: [id number]  Age / Sex: 86 y.o., male  PCP: Glendale Chard, MD Referring Physician: Damita Lack, MD  Reason for Consultation: Establishing goals of care  HPI/Patient Profile: 86 y.o. male  with past medical history of mild dementia, hypertension, HLD, subdural hematoma 2018 admitted on 05/08/2019 from home with inability to speak but able to communicate with head nod. Wife died just ~ 1 week prior to admission. He lives alone and baseline able to perform all ADLs and able to converse fluently. Found to be COVID-19 positive with acute kidney injury. Requiring dysphagia 3 diet. Continues with poor intake, refusing labs, and overall failure to thrive.   Clinical Assessment and Goals of Care: I met today with Henry Harvey. He is lying on his side in his recliner. He arouses and greets me when I come in. He is able to tell me that he is here because of COVID and that Barbette Or becomes president today. He denies any pain, discomfort, or respiratory difficulty other than he feels he needs to have a bowel movement. I called nurses to assist. He has his lunch tray which has barely been touched other than a few bites although he has drank about half his fluids. I encouraged him to eat and drink more for dinner. He tells me that he has always "since I was a little boy" eaten this way and very little. He does tell me that he likes Ensure and drinks this at home. He agrees with transition for rehab trial. He gives me permission to speak with his daughter.   I called and spoke with daughter, Henry Harvey. Henry Harvey has just spoken with Dr. Reesa Chew and her main concern is his intake appropriately so. She is hopeful that this will improve enough for him to go to rehab. She is hopeful that he will be able to return home eventually but knows this may not happen. She  speaks of needing assisted living or that he could potentially come live with her but she knows that he has always valued his independence. I discussed with her encouraging him to eat and drink and to push Ensure as well. I asked her about concern for depression given his wife of 11 yrs recently died (I offered my condolences) and she says that he has never showed his emotions and never complains so it is difficult to know. We agree with trial of medication that could assist with underlying depression as well as stimulate appetite. Henry Harvey is hopeful that he will be able to improve enough for rehab and to continue working with PT/OT/SLP.   All questions/concerns addressed. Emotional support provided. Discussed with Dr. Reesa Chew.   Primary Decision Maker Patient and daughter Henry Harvey    SUMMARY OF RECOMMENDATIONS   - Hopeful for improvement  Code Status/Advance Care Planning:  DNR - already established   Symptom Management:   Depression/poor appetite: Zyprexa 2.5 mg qhs. May increase to 5 mg if ineffective.   Palliative  Prophylaxis:   Aspiration, Bowel Regimen, Delirium Protocol, Frequent Pain Assessment and Turn Reposition  Psycho-social/Spiritual:   Desire for further Chaplaincy support:no  Additional Recommendations: Caregiving  Support/Resources and Grief/Bereavement Support  Prognosis:   Overall prognosis is poor with advanced age and poor intake (especially with context of recent loss of his wife)  Discharge Planning: Bartholomew for rehab with Palliative care service follow-up      Primary Diagnoses: Present on Admission: . Hypertension . Hyperlipidemia . Elevated troponin . Hyperkalemia . Aphasia . Acute metabolic encephalopathy . High anion gap metabolic acidosis . COVID-19 virus infection . Acute kidney injury superimposed on CKD ([place]) . Dehydration with hypernatremia   I have reviewed the medical record, interviewed the patient and family, and  examined the patient. The following aspects are pertinent.  Past Medical History:  Diagnosis Date  . Dementia ([place])   . Essential hypertension   . Hyperlipidemia    Social History   Socioeconomic History  . Marital status: Widowed    Spouse name: Not on file  . Number of children: Not on file  . Years of education: Not on file  . Highest education level: Not on file  Occupational History  . Occupation: retired  Tobacco Use  . Smoking status: Former Smoker    Types: Cigarettes    Start date: 07/14/1946    Quit date: 07/14/1983    Years since quitting: 35.8  . Smokeless tobacco: Never Used  . Tobacco comment: 21/2 PPD  Substance and Sexual Activity  . Alcohol use: No  . Drug use: No  . Sexual activity: Not Currently  Other Topics Concern  . Not on file  Social History Narrative  . Not on file   Social Determinants of Health   Financial Resource Strain: Low Risk   . Difficulty of Paying Living Expenses: Not hard at all  Food Insecurity: No Food Insecurity  . Worried About Charity fundraiser in the Last Year: Never true  . Ran Out of Food in the Last Year: Never true  Transportation Needs: No Transportation Needs  . Lack of Transportation (Medical): No  . Lack of Transportation (Non-Medical): No  Physical Activity: Inactive  . Days of Exercise per Week: 0 days  . Minutes of Exercise per Session: 0 min  Stress: No Stress Concern Present  . Feeling of Stress : Not at all  Social Connections:   . Frequency of Communication with Friends and Family: Not on file  . Frequency of Social Gatherings with Friends and Family: Not on file  . Attends Religious Services: Not on file  . Active Member of Clubs or Organizations: Not on file  . Attends Archivist Meetings: Not on file  . Marital Status: Not on file   Family History  Problem Relation Age of Onset  . CVA Father    Scheduled Meds: . atorvastatin  20 mg Oral Daily  . chlorhexidine  15 mL Mouth Rinse BID   . dexamethasone (DECADRON) injection  6 mg Intravenous Q24H  . dorzolamide-timolol  1 drop Right Eye BID  . feeding supplement (ENSURE ENLIVE)  237 mL Oral TID BM  . insulin aspart  0-9 Units Subcutaneous Q4H  . mouth rinse  15 mL Mouth Rinse q12n4p   Continuous Infusions: . remdesivir 100 mg in NS 100 mL 100 mg (05/18/19 0954)   PRN Meds:. Allergies  Allergen Reactions  . Penicillins Rash and Other (See Comments)    Has patient had a PCN  reaction causing immediate rash, facial/tongue/throat swelling, SOB or lightheadedness with hypotension: Yes Has patient had a PCN reaction causing severe rash involving mucus membranes or skin necrosis: No Has patient had a PCN reaction that required hospitalization: No Has patient had a PCN reaction occurring within the last 10 years: No If all of the above answers are "NO", then may proceed with Cephalosporin use.   Review of Systems  Constitutional: Negative for activity change and appetite change.  Respiratory: Negative for cough and shortness of breath.   Neurological: Positive for weakness.    Physical Exam Vitals and nursing note reviewed.  Constitutional:      General: He is not in acute distress.    Appearance: He is underweight. He is ill-appearing.     Comments: Muscle wasting, thin, frail  Cardiovascular:     Rate and Rhythm: Normal rate.  Pulmonary:     Effort: Pulmonary effort is normal. No tachypnea, accessory muscle usage or respiratory distress.  Abdominal:     General: Abdomen is flat.     Palpations: Abdomen is soft.  Neurological:     Mental Status: He is alert.     Comments: Mostly oriented with delayed responses  Psychiatric:     Comments: Impaired judgement and uncooperative at times     Vital Signs: BP 115/83 (BP Location: Left Leg)   Pulse 86   Temp (!) 97.5 F (36.4 C) (Axillary)   Resp 18   Ht 5' 1.8" (1.57 m)   SpO2 95%   BMI 19.77 kg/m  Pain Scale: 0-10   Pain Score: 0-No pain   SpO2: SpO2:  95 % O2 Device:SpO2: 95 % O2 Flow Rate: .O2 Flow Rate (L/min): 2 L/min  IO: Intake/output summary:   Intake/Output Summary (Last 24 hours) at 05/18/2019 1351 Last data filed at 05/17/2019 1800 Gross per 24 hour  Intake 220 ml  Output -  Net 220 ml    LBM: Last BM Date: (PTA) Baseline Weight:   Most recent weight:       Palliative Assessment/Data:     Time In: 1430 Time Out: 1530 Time Total: 60 min Greater than 50%  of this time was spent counseling and coordinating care related to the above assessment and plan.  Signed by: Vinie Sill, NP Palliative Medicine Team Pager # [phone] (M-F 8a-5p) Team Phone # [phone] (Nights/Weekends)

## 2019-05-18 NOTE — Progress Notes (Signed)
Occupational Therapy Treatment Patient Details Name: Henry Harvey MRN: [id number] DOB: 02-Dec-1926 Today's Date: 05/18/2019    History of present illness Patient is 86 year old male presenting with lack of speech LSN 6pm 1/16. Daughter at bedside states his baseline is able to conduct all ADLs and no altered mental status and able to speak fluently.  Patient is neuro intact on my exam and follows commands well and shakes his head yes and no to questions asked.  Admitted with AMS and AKI with electrolyte disturbance; CT neg; tested pos for covid   OT comments  Pt agreeable to OOB to chair with therapies. Requiring max assist to achieve sitting EOB and plus 2 mod assist to stand and took pivotal steps to chair. Pt refusing further mobility, but performed seated grooming and drinking. Pt pleasant during session. Aware the new president was Barbette Or and that he was in the hospital.   Follow Up Recommendations  SNF;Supervision/Assistance - 24 hour    Equipment Recommendations  (defer to next venue)    Recommendations for Other Services      Precautions / Restrictions Precautions Precautions: Fall       Mobility Bed Mobility Overal bed mobility: Needs Assistance Bed Mobility: Supine to Sit     Supine to sit: Max assist     General bed mobility comments: decreased initiation, assist for all aspects up to L side of bed  Transfers Overall transfer level: Needs assistance Equipment used: 2 person hand held assist Transfers: Sit to/from Stand;Stand Pivot Transfers Sit to Stand: +2 physical assistance;Mod assist Stand pivot transfers: +2 physical assistance;Mod assist       General transfer comment: assist to initiate mobility and power up.    Balance Overall balance assessment: Needs assistance Sitting-balance support: Feet supported;No upper extremity supported Sitting balance-Leahy Scale: Fair     Standing balance support: Bilateral upper extremity supported;During functional  activity Standing balance-Leahy Scale: Poor Standing balance comment: reliant on external support                           ADL either performed or assessed with clinical judgement   ADL Overall ADL's : Needs assistance/impaired Eating/Feeding: Set up;Sitting Eating/Feeding Details (indicate cue type and reason): drink Grooming: Wash/dry hands;Wash/dry face;Set up;Sitting Grooming Details (indicate cue type and reason): decreased thoroughness             Lower Body Dressing: Total assistance;Sitting/lateral leans Lower Body Dressing Details (indicate cue type and reason): socks                     Vision       Perception     Praxis      Cognition Arousal/Alertness: Awake/alert Behavior During Therapy: WFL for tasks assessed/performed Overall Cognitive Status: Impaired/Different from baseline Area of Impairment: Orientation;Memory;Safety/judgement;Awareness;Following commands                 Orientation Level: Disoriented to;Situation;Time   Memory: Decreased short-term memory Following Commands: Follows one step commands with increased time Safety/Judgement: Decreased awareness of safety;Decreased awareness of deficits Awareness: Intellectual            Exercises     Shoulder Instructions       General Comments Pt on RA with SpO2 > 90%.    Pertinent Vitals/ Pain       Pain Assessment: Faces Faces Pain Scale: No hurt  Home Living  Prior Functioning/Environment              Frequency  Min 2X/week        Progress Toward Goals  OT Goals(current goals can now be found in the care plan section)  Progress towards OT goals: Progressing toward goals  Acute Rehab OT Goals Patient Stated Goal: not stated OT Goal Formulation: With patient Time For Goal Achievement: 05/29/19 Potential to Achieve Goals: Good  Plan Discharge plan remains appropriate     Co-evaluation    PT/OT/SLP Co-Evaluation/Treatment: Yes Reason for Co-Treatment: For patient/therapist safety PT goals addressed during session: Mobility/safety with mobility;Balance OT goals addressed during session: ADL's and self-care      AM-PAC OT "6 Clicks" Daily Activity     Outcome Measure   Help from another person eating meals?: A Little Help from another person taking care of personal grooming?: A Little Help from another person toileting, which includes using toliet, bedpan, or urinal?: Total Help from another person bathing (including washing, rinsing, drying)?: A Lot Help from another person to put on and taking off regular upper body clothing?: A Lot Help from another person to put on and taking off regular lower body clothing?: Total 6 Click Score: 12    End of Session Equipment Utilized During Treatment: Gait belt  OT Visit Diagnosis: Unsteadiness on feet (R26.81);Muscle weakness (generalized) (M62.81);Other symptoms and signs involving cognitive function   Activity Tolerance Patient tolerated treatment well   Patient Left in chair;with call bell/phone within reach;with chair alarm set   Nurse Communication Mobility status        Time: 1131-1155 OT Time Calculation (min): 24 min  Charges: OT General Charges $OT Visit: 1 Visit OT Treatments $Self Care/Home Management : 8-22 mins  Martie Round, OTR/L Acute Rehabilitation Services Pager: [phone] Office: [phone]   Evern Bio 05/18/2019, 1:34 PM

## 2019-05-19 LAB — CBC WITH DIFFERENTIAL/PLATELET
Abs Immature Granulocytes: 0.12 10*3/uL — ABNORMAL HIGH (ref 0.00–0.07)
Basophils Absolute: 0 10*3/uL (ref 0.0–0.1)
Basophils Relative: 0 %
Eosinophils Absolute: 0 10*3/uL (ref 0.0–0.5)
Eosinophils Relative: 0 %
HCT: 47.5 % (ref 39.0–52.0)
Hemoglobin: 15.7 g/dL (ref 13.0–17.0)
Immature Granulocytes: 1 %
Lymphocytes Relative: 6 %
Lymphs Abs: 0.5 10*3/uL — ABNORMAL LOW (ref 0.7–4.0)
MCH: 28.6 pg (ref 26.0–34.0)
MCHC: 33.1 g/dL (ref 30.0–36.0)
MCV: 86.7 fL (ref 80.0–100.0)
Monocytes Absolute: 0.3 10*3/uL (ref 0.1–1.0)
Monocytes Relative: 3 %
Neutro Abs: 8.5 10*3/uL — ABNORMAL HIGH (ref 1.7–7.7)
Neutrophils Relative %: 90 %
Platelets: 219 10*3/uL (ref 150–400)
RBC: 5.48 MIL/uL (ref 4.22–5.81)
RDW: 14.7 % (ref 11.5–15.5)
WBC: 9.5 10*3/uL (ref 4.0–10.5)
nRBC: 0 % (ref 0.0–0.2)

## 2019-05-19 LAB — COMPREHENSIVE METABOLIC PANEL
ALT: 106 U/L — ABNORMAL HIGH (ref 0–44)
AST: 116 U/L — ABNORMAL HIGH (ref 15–41)
Albumin: 2.3 g/dL — ABNORMAL LOW (ref 3.5–5.0)
Alkaline Phosphatase: 94 U/L (ref 38–126)
Anion gap: 12 (ref 5–15)
BUN: 82 mg/dL — ABNORMAL HIGH (ref 8–23)
CO2: 15 mmol/L — ABNORMAL LOW (ref 22–32)
Calcium: 8.5 mg/dL — ABNORMAL LOW (ref 8.9–10.3)
Chloride: 119 mmol/L — ABNORMAL HIGH (ref 98–111)
Creatinine, Ser: 1.96 mg/dL — ABNORMAL HIGH (ref 0.61–1.24)
GFR calc Af Amer: 34 mL/min — ABNORMAL LOW (ref >60)
GFR calc non Af Amer: 29 mL/min — ABNORMAL LOW (ref >60)
Glucose, Bld: 140 mg/dL — ABNORMAL HIGH (ref 70–99)
Potassium: 5.3 mmol/L — ABNORMAL HIGH (ref 3.5–5.1)
Sodium: 146 mmol/L — ABNORMAL HIGH (ref 135–145)
Total Bilirubin: 2 mg/dL — ABNORMAL HIGH (ref 0.3–1.2)
Total Protein: 5.6 g/dL — ABNORMAL LOW (ref 6.5–8.1)

## 2019-05-19 LAB — GLUCOSE, CAPILLARY
Glucose-Capillary: 118 mg/dL — ABNORMAL HIGH (ref 70–99)
Glucose-Capillary: 124 mg/dL — ABNORMAL HIGH (ref 70–99)
Glucose-Capillary: 137 mg/dL — ABNORMAL HIGH (ref 70–99)
Glucose-Capillary: 151 mg/dL — ABNORMAL HIGH (ref 70–99)
Glucose-Capillary: 178 mg/dL — ABNORMAL HIGH (ref 70–99)

## 2019-05-19 LAB — C-REACTIVE PROTEIN: CRP: 2.3 mg/dL — ABNORMAL HIGH (ref <1.0)

## 2019-05-19 LAB — FERRITIN: Ferritin: 1346 ng/mL — ABNORMAL HIGH (ref 24–336)

## 2019-05-19 LAB — PHOSPHORUS: Phosphorus: 4.1 mg/dL (ref 2.5–4.6)

## 2019-05-19 LAB — D-DIMER, QUANTITATIVE: D-Dimer, Quant: 6.53 ug/mL-FEU — ABNORMAL HIGH (ref 0.00–0.50)

## 2019-05-19 LAB — MAGNESIUM: Magnesium: 2.6 mg/dL — ABNORMAL HIGH (ref 1.7–2.4)

## 2019-05-19 MED ORDER — SODIUM CHLORIDE 0.45 % IV SOLN: INTRAVENOUS | Status: AC

## 2019-05-19 MED ORDER — SODIUM CHLORIDE 0.45 % IV BOLUS
500.0000 mL | Freq: Once | INTRAVENOUS | Status: AC
  Administered 2019-05-19: 17:00:00 500 mL via INTRAVENOUS

## 2019-05-19 MED ORDER — SODIUM CHLORIDE 0.45 % IV SOLN: INTRAVENOUS | Status: DC

## 2019-05-19 NOTE — Progress Notes (Signed)
PROGRESS NOTE    Henry Harvey  VEL:[id number] DOB: 08-05-1926 DOA: 05/12/2019 PCP: Dorothyann Peng, MD   Brief Narrative:  86 year old recently widowed male, PMH of HTN, HLD, prior subdural hematoma s/p bur hole 2018, suspected dementia, recently lost his wife, last known normal was day prior to admission, presented with aphasia and altered mental status.  At baseline reportedly alert, oriented, active, able to conduct all activities of daily living and take care of himself.  Admitted for aphasia, acute metabolic encephalopathy, dehydration with hypernatremia, hyperkalemia, acute on chronic kidney disease and COVID-19 positive.  During the hospitalization patient was started on steroids and remdesivir.  He continued to have very poor oral intake, failure to thrive despite of encouragement.  He refused any lab work as well.  Daughter was aware, palliative care was consulted.  He was decided to transition patient to skilled nursing facility with palliative care follow-up.   Assessment & Plan:   Principal Problem:   Acute metabolic encephalopathy Active Problems:   Hypertension   Hyperlipidemia   Elevated troponin   Hyperkalemia   Aphasia   High anion gap metabolic acidosis   COVID-19 virus infection   Acute kidney injury superimposed on CKD (HCC)   Dehydration with hypernatremia   Poor appetite   Goals of care, counseling/discussion   Palliative care encounter  Aphasia, intermittent Acute metabolic encephalopathy, intermittent -Suspect underlying metabolic issues versus infection.  CT head and MRI brain is negative -Neuro exam remains nonfocal -TSH, B12 within normal limits.  UA-negative, UDS-negative  Acute kidney injury on CKD stage III A Hyperkalemia -Baseline creatinine 1.3, last creatinine 3.9.  Renal ultrasound-negative -Gentle hydration.  Labs today show creatinine 1.96.  Intermittently refusing IV fluids.  Previously has refused lab work but now allowing to work with the  staff.  Multiple metabolic derangements -Hypernatremia, hyperkalemia, anion gap metabolic acidosis.  Slowly improving  COVID-19 pneumonia -Oxygen levels-room air -Remdesivir-day 5 -Decadron-day 5 -Routine: Labs have been reviewed including ferritin, LDH, CRP, d-dimer, fibrinogen.  Will need to trend this lab daily. -Vitamin C & Zinc. Prone >16hrs/day.  -Supportive care-antitussive, inhalers, I-S/flutter -CODE STATUS confirmed  Dysphagia -Speech and swallow-dysphagia 3 diet.  Additional supplements added.  Essential hypertension -Antihypertensives on hold  Hyperlipidemia -Statin  Diabetes mellitus type 2 -Continue current regimen.  Hyperlipidemia -Statin  Adult failure to thrive -Multifactorial complicated by ongoing issues and recent loss of spouse.  Palliative care medicine consulted to help establish goals of care Overall patient has poor prognosis given lack of desire to eat and drink which will further worsen his malnourishment.  DVT prophylaxis: SCDs Code Status: DNR Family Communication: Spoke with patient's daughter Gunnar Fusi on 05/18/2019 with the plan. Disposition Plan: Awaiting safe disposition planning to skilled nursing facility.  Consultants:   Palliative care  Procedures:     Antimicrobials:   None   Subjective: Easily arousable, laying in the bed.  Does not eat and drink much.  No other complaints.  Review of Systems Otherwise negative except as per HPI, including: General = no fevers, chills, dizziness, malaise, fatigue HEENT/EYES = negative for pain, redness, loss of vision, double vision, blurred vision, loss of hearing, sore throat, hoarseness, dysphagia Cardiovascular= negative for chest pain, palpitation, murmurs, lower extremity swelling Respiratory/lungs= negative for shortness of breath, cough, hemoptysis, wheezing, mucus production Gastrointestinal= negative for nausea, vomiting,, abdominal pain, melena, hematemesis Genitourinary=  negative for Dysuria, Hematuria, Change in Urinary Frequency MSK = Negative for arthralgia, myalgias, Back Pain, Joint swelling  Neurology= Negative for headache, seizures, numbness,  tingling  Psychiatry= Negative for anxiety, depression, suicidal and homocidal ideation Allergy/Immunology= Medication/Food allergy as listed  Skin= Negative for Rash, lesions, ulcers, itching   Objective: Vitals:   05/18/19 1541 05/18/19 2010 05/19/19 0400 05/19/19 0800  BP: 104/90 99/65  110/68  Pulse: 65 85    Resp: 16 19  18   Temp:  99.1 F (37.3 C) 98.7 F (37.1 C) 98.3 F (36.8 C)  TempSrc:  Oral Oral Oral  SpO2: 96% 91% 95%   Height:        Intake/Output Summary (Last 24 hours) at 05/19/2019 1154 Last data filed at 05/18/2019 1800 Gross per 24 hour  Intake 100 ml  Output --  Net 100 ml   There were no vitals filed for this visit.  Examination:  Constitutional: Cachectic elderly frail with bilateral temporal wasting and generalized muscle wasting Respiratory: Minimal bibasilar crackles Cardiovascular: Normal sinus rhythm, no rubs Abdomen: Nontender nondistended good bowel sounds Musculoskeletal: No edema noted Skin: No rashes seen Neurologic: CN 2-12 grossly intact.  And nonfocal Psychiatric: Poor judgment and insight.  Alert to his name only.  Data Reviewed:   CBC: Recent Labs  Lab 05/17/2019 1543 05/10/2019 1556 05/15/19 0029 05/18/19 0433 05/19/19 0523  WBC 8.7  --  9.2 10.9* 9.5  NEUTROABS 7.4  --   --  9.9* 8.5*  HGB 17.8* 18.4* 14.0 15.0 15.7  HCT 56.2* 54.0* 44.0 46.3 47.5  MCV 90.9  --  89.8 89.0 86.7  PLT 305  --  235 252 219   Basic Metabolic Panel: Recent Labs  Lab 05/07/2019 1543 05/10/2019 1556 05/15/19 0029 05/19/19 0523  NA 151* 152* 154* 146*  K 5.4* 5.3* 5.1 5.3*  CL 112*  --  123* 119*  CO2 17*  --  18* 15*  GLUCOSE 144*  --  118* 140*  BUN 117*  --  107* 82*  CREATININE 3.93*  --  3.38* 1.96*  CALCIUM 9.5  --  8.0* 8.5*  MG  --   --   --  2.6*   PHOS  --   --   --  4.1   GFR: CrCl cannot be calculated (Unknown ideal weight.). Liver Function Tests: Recent Labs  Lab 05/13/2019 1543 05/19/19 0523  AST 30 116*  ALT 23 106*  ALKPHOS 71 94  BILITOT 1.3* 2.0*  PROT 8.4* 5.6*  ALBUMIN 3.1* 2.3*   No results for input(s): LIPASE, AMYLASE in the last 168 hours. Recent Labs  Lab 05/15/19 0029  AMMONIA 29   Coagulation Profile: No results for input(s): INR, PROTIME in the last 168 hours. Cardiac Enzymes: Recent Labs  Lab 05/23/2019 1543  CKTOTAL 225   BNP (last 3 results) No results for input(s): PROBNP in the last 8760 hours. HbA1C: No results for input(s): HGBA1C in the last 72 hours. CBG: Recent Labs  Lab 05/18/19 1149 05/18/19 1637 05/18/19 2020 05/19/19 0025 05/19/19 0833  GLUCAP 106* 122* 87 151* 118*   Lipid Profile: No results for input(s): CHOL, HDL, LDLCALC, TRIG, CHOLHDL, LDLDIRECT in the last 72 hours. Thyroid Function Tests: No results for input(s): TSH, T4TOTAL, FREET4, T3FREE, THYROIDAB in the last 72 hours. Anemia Panel: Recent Labs    05/18/19 0433 05/19/19 0523  FERRITIN 1,056* 1,346*   Sepsis Labs: Recent Labs  Lab 05/13/2019 2123 05/15/19 0029 05/15/19 0330  LATICACIDVEN 3.4* 1.8 2.1*    Recent Results (from the past 240 hour(s))  SARS CORONAVIRUS 2 (TAT 6-24 HRS) Nasopharyngeal Nasopharyngeal Swab     Status:  Abnormal   Collection Time: 06-06-19  4:47 PM   Specimen: Nasopharyngeal Swab  Result Value Ref Range Status   SARS Coronavirus 2 POSITIVE (A) NEGATIVE Final    Comment: RESULT CALLED TO, READ BACK BY AND VERIFIED WITH: N. Arcelia Jew [id number] 05/15/2019 T. TYSOR (NOTE) SARS-CoV-2 target nucleic acids are DETECTED. The SARS-CoV-2 RNA is generally detectable in upper and lower respiratory specimens during the acute phase of infection. Positive results are indicative of the presence of SARS-CoV-2 RNA. Clinical correlation with patient history and other diagnostic information is   necessary to determine patient infection status. Positive results do not rule out bacterial infection or co-infection with other viruses.  The expected result is Negative. Fact Sheet for Patients: SugarRoll.be Fact Sheet for Healthcare Providers: https://www.woods-mathews.com/ This test is not yet approved or cleared by the [place] FDA and  has been authorized for detection and/or diagnosis of SARS-CoV-2 by FDA under an Emergency Use Authorization (EUA). This EUA will remain  in effect (meaning this test can be used) for  the duration of the COVID-19 declaration under Section 564(b)(1) of the Act, 21 U.S.C. section 360bbb-3(b)(1), unless the authorization is terminated or revoked sooner. Performed at [facility], [address] [place],  [address]          Radiology Studies: No results found.      Scheduled Meds: . atorvastatin  20 mg Oral Daily  . chlorhexidine  15 mL Mouth Rinse BID  . dexamethasone (DECADRON) injection  6 mg Intravenous Q24H  . dorzolamide-timolol  1 drop Right Eye BID  . feeding supplement (ENSURE ENLIVE)  237 mL Oral TID BM  . insulin aspart  0-9 Units Subcutaneous Q4H  . mouth rinse  15 mL Mouth Rinse q12n4p  . OLANZapine zydis  2.5 mg Oral QHS   Continuous Infusions:    LOS: 5 days   Time spent= 15 mins    Areebah Meinders Arsenio Loader, MD [facility]  If 7PM-7AM, please contact night-coverage  05/19/2019, 11:54 AM

## 2019-05-19 NOTE — Progress Notes (Signed)
Physical Therapy Treatment Patient Details Name: Henry Harvey MRN: [id number] DOB: [birth year] Today's Date: 05/19/2019    History of Present Illness Patient is 86 year old male presenting with lack of speech LSN 6pm 1/16. Daughter at bedside states his baseline is able to conduct all ADLs and no altered mental status and able to speak fluently.  Patient is neuro intact on my exam and follows commands well and shakes his head yes and no to questions asked.  Admitted with AMS and AKI with electrolyte disturbance; CT neg; tested pos for covid    PT Comments    Patient received in bed, resistant to working with PT but ultimately agreeable to participate. Continues to require +2 ModA for safety with all mobility today including bed mobility and pivot transfer to chair. Definitely still with significantly impaired balance at EOB and with little initiation for mobility. He was positioned to comfort in the chair with all needs met, chair alarm active. Satted at 92% on room air but did display some accessory breathing patterns with activity this morning. Plan continues to be SNF, adjusted frequency to match.    Follow Up Recommendations  SNF;Supervision/Assistance - 24 hour     Equipment Recommendations  Rolling walker with 5" wheels;3in1 (PT)    Recommendations for Other Services       Precautions / Restrictions Precautions Precautions: Fall Restrictions Weight Bearing Restrictions: No    Mobility  Bed Mobility Overal bed mobility: Needs Assistance Bed Mobility: Supine to Sit     Supine to sit: Mod assist;+2 for physical assistance     General bed mobility comments: ModA x2 to manage BLEs and bring trunk to upright; initially trying to lay back down and needed ModA to maintain upright but faded to Min guard  Transfers Overall transfer level: Needs assistance Equipment used: 2 person hand held assist Transfers: Sit to/from [facility] Sit to Stand: +2 physical  assistance;Mod assist Stand pivot transfers: +2 physical assistance;Mod assist       General transfer comment: assist to power up and pivot hips to transfer, limited initiation  Ambulation/Gait             General Gait Details: deferred- refusal   Stairs             Wheelchair Mobility    Modified Rankin (Stroke Patients Only)       Balance Overall balance assessment: Needs assistance Sitting-balance support: Feet supported;No upper extremity supported Sitting balance-Leahy Scale: Fair     Standing balance support: Bilateral upper extremity supported;During functional activity   Standing balance comment: reliant on external support                            Cognition Arousal/Alertness: Awake/alert Behavior During Therapy: WFL for tasks assessed/performed Overall Cognitive Status: Impaired/Different from baseline Area of Impairment: Orientation;Memory;Safety/judgement;Awareness;Following commands                 Orientation Level: Disoriented to;Situation;Time   Memory: Decreased short-term memory Following Commands: Follows one step commands with increased time Safety/Judgement: Decreased awareness of safety;Decreased awareness of deficits Awareness: Intellectual          Exercises      General Comments General comments (skin integrity, edema, etc.): 92% on room air, still with accessory breathing pattern      Pertinent Vitals/Pain Pain Assessment: Faces Pain Score: 0-No pain Faces Pain Scale: No hurt Pain Intervention(s): Limited activity within patient's tolerance;Monitored during session  Home Living                      Prior Function            PT Goals (current goals can now be found in the care plan section) Acute Rehab PT Goals Patient Stated Goal: not stated PT Goal Formulation: Patient unable to participate in goal setting Time For Goal Achievement: 05/29/19 Potential to Achieve Goals:  Fair Progress towards PT goals: Progressing toward goals    Frequency    Min 2X/week      PT Plan Frequency needs to be updated    Co-evaluation              AM-PAC PT "6 Clicks" Mobility   Outcome Measure  Help needed turning from your back to your side while in a flat bed without using bedrails?: A Little Help needed moving from lying on your back to sitting on the side of a flat bed without using bedrails?: A Lot Help needed moving to and from a bed to a chair (including a wheelchair)?: A Lot Help needed standing up from a chair using your arms (e.g., wheelchair or bedside chair)?: A Lot Help needed to walk in hospital room?: Total Help needed climbing 3-5 steps with a railing? : Total 6 Click Score: 11    End of Session Equipment Utilized During Treatment: Gait belt Activity Tolerance: Patient tolerated treatment well Patient left: in chair;with call bell/phone within reach;with chair alarm set   PT Visit Diagnosis: Unsteadiness on feet (R26.81);Other abnormalities of gait and mobility (R26.89);Muscle weakness (generalized) (M62.81)     Time: 5146-0479 PT Time Calculation (min) (ACUTE ONLY): 21 min  Charges:  $Therapeutic Activity: 8-22 mins                     Windell Norfolk, DPT, PN1   Supplemental Physical Therapist [place]    Pager [phone] Acute Rehab Office [phone]

## 2019-05-19 NOTE — Progress Notes (Signed)
Palliative:  HPI: 86 y.o. male  with past medical history of mild dementia, hypertension, HLD, subdural hematoma 2018 admitted on 06/04/2019 from home with inability to speak but able to communicate with head nod. Wife died just ~ 1 week prior to admission. He lives alone and baseline able to perform all ADLs and able to converse fluently. Found to be COVID-19 positive with acute kidney injury. Requiring dysphagia 3 diet. Continues with poor intake, refusing labs, and overall failure to thrive.   I discussed case with bedside RN who reports that he is still not eating but is drinking his Ensure and some liquids. Renal function has improved. He reluctantly worked with PT.   I did call and speak with daughter, Gunnar Fusi, and explained the above update. She continues to be hopeful for transition to rehab and hopeful he will be cooperative to participate and have improved intake. She is happy that he is at least drinking his Ensure. She had no further questions or concerns.   Plan: - Transition to SNF rehab - Continue Zyprexa. Could consider increase to 5 mg qhs.  - Consider outpatient palliative referral.   15 min  Yong Channel, NP Palliative Medicine Team Pager [phone] (Please see [email or web address] for schedule) Team Phone [phone]    Greater than 50%  of this time was spent counseling and coordinating care related to the above assessment and plan   The above conversation was completed via telephone due to the visitor restrictions during the COVID-19 pandemic. Thorough chart review and discussion with necessary members of the care team was completed as part of assessment. All issues were discussed and addressed but no physical exam was performed.

## 2019-05-19 NOTE — Progress Notes (Signed)
Attempted to stick patient for IV without success in the R AC. Will continue to monitor patient.

## 2019-05-19 NOTE — TOC Progression Note (Addendum)
Transition of Care [facility]) - Progression Note    Patient Details  Name: Henry Harvey MRN: [id number] Date of Birth: 1926-06-02  [facility] CM/SW Contact  Mearl Latin, LCSW Phone Number: 05/19/2019, 10:30 AM  Clinical Narrative:    [facility] able to accept patient pending pasrr number (under review still, CSW spoke with B. Akins to complete screening). CSW sent clinicals to insurance for review.   Update 3pm: Therapist, occupational received [id number]. Pasrr still pending.    Expected Discharge Plan: Skilled Nursing Facility Barriers to Discharge: Continued Medical Work up  Expected Discharge Plan and Services Expected Discharge Plan: Skilled Nursing Facility       Living arrangements for the past 2 months: Single Family Home                                       Social Determinants of Health (SDOH) Interventions    Readmission Risk Interventions No flowsheet data found.

## 2019-05-20 LAB — COMPREHENSIVE METABOLIC PANEL
ALT: 96 U/L — ABNORMAL HIGH (ref 0–44)
AST: 98 U/L — ABNORMAL HIGH (ref 15–41)
Albumin: 2.3 g/dL — ABNORMAL LOW (ref 3.5–5.0)
Alkaline Phosphatase: 192 U/L — ABNORMAL HIGH (ref 38–126)
Anion gap: 11 (ref 5–15)
BUN: 93 mg/dL — ABNORMAL HIGH (ref 8–23)
CO2: 16 mmol/L — ABNORMAL LOW (ref 22–32)
Calcium: 8.5 mg/dL — ABNORMAL LOW (ref 8.9–10.3)
Chloride: 121 mmol/L — ABNORMAL HIGH (ref 98–111)
Creatinine, Ser: 2.4 mg/dL — ABNORMAL HIGH (ref 0.61–1.24)
GFR calc Af Amer: 27 mL/min — ABNORMAL LOW (ref >60)
GFR calc non Af Amer: 23 mL/min — ABNORMAL LOW (ref >60)
Glucose, Bld: 117 mg/dL — ABNORMAL HIGH (ref 70–99)
Potassium: 5.7 mmol/L — ABNORMAL HIGH (ref 3.5–5.1)
Sodium: 148 mmol/L — ABNORMAL HIGH (ref 135–145)
Total Bilirubin: 1.8 mg/dL — ABNORMAL HIGH (ref 0.3–1.2)
Total Protein: 5.6 g/dL — ABNORMAL LOW (ref 6.5–8.1)

## 2019-05-20 LAB — CBC WITH DIFFERENTIAL/PLATELET
Abs Immature Granulocytes: 0.15 10*3/uL — ABNORMAL HIGH (ref 0.00–0.07)
Basophils Absolute: 0 10*3/uL (ref 0.0–0.1)
Basophils Relative: 0 %
Eosinophils Absolute: 0 10*3/uL (ref 0.0–0.5)
Eosinophils Relative: 0 %
HCT: 45.4 % (ref 39.0–52.0)
Hemoglobin: 14.9 g/dL (ref 13.0–17.0)
Immature Granulocytes: 1 %
Lymphocytes Relative: 4 %
Lymphs Abs: 0.5 10*3/uL — ABNORMAL LOW (ref 0.7–4.0)
MCH: 28.9 pg (ref 26.0–34.0)
MCHC: 32.8 g/dL (ref 30.0–36.0)
MCV: 88.2 fL (ref 80.0–100.0)
Monocytes Absolute: 0.3 10*3/uL (ref 0.1–1.0)
Monocytes Relative: 3 %
Neutro Abs: 11.5 10*3/uL — ABNORMAL HIGH (ref 1.7–7.7)
Neutrophils Relative %: 92 %
Platelets: 199 10*3/uL (ref 150–400)
RBC: 5.15 MIL/uL (ref 4.22–5.81)
RDW: 14.6 % (ref 11.5–15.5)
WBC: 12.5 10*3/uL — ABNORMAL HIGH (ref 4.0–10.5)
nRBC: 0 % (ref 0.0–0.2)

## 2019-05-20 LAB — GLUCOSE, CAPILLARY
Glucose-Capillary: 129 mg/dL — ABNORMAL HIGH (ref 70–99)
Glucose-Capillary: 152 mg/dL — ABNORMAL HIGH (ref 70–99)
Glucose-Capillary: 154 mg/dL — ABNORMAL HIGH (ref 70–99)
Glucose-Capillary: 186 mg/dL — ABNORMAL HIGH (ref 70–99)
Glucose-Capillary: 89 mg/dL (ref 70–99)

## 2019-05-20 LAB — PHOSPHORUS: Phosphorus: 3.3 mg/dL (ref 2.5–4.6)

## 2019-05-20 LAB — MAGNESIUM: Magnesium: 2.5 mg/dL — ABNORMAL HIGH (ref 1.7–2.4)

## 2019-05-20 NOTE — Progress Notes (Signed)
PROGRESS NOTE    Henry Harvey  GEX:[id number] DOB: 06-03-1926 DOA: 05/10/2019 PCP: Glendale Chard, MD   Brief Narrative:  86 year old recently widowed male, PMH of HTN, HLD, prior subdural hematoma s/p bur hole 2018, suspected dementia, recently lost his wife, last known normal was day prior to admission, presented with aphasia and altered mental status.  At baseline reportedly alert, oriented, active, able to conduct all activities of daily living and take care of himself.  Admitted for aphasia, acute metabolic encephalopathy, dehydration with hypernatremia, hyperkalemia, acute on chronic kidney disease and COVID-19 positive.  During the hospitalization patient was started on steroids and remdesivir.  He continued to have very poor oral intake, failure to thrive despite of encouragement.  He refused any lab work as well.  Daughter was aware, palliative care was consulted.  He was decided to transition patient to skilled nursing facility with palliative care follow-up.   Assessment & Plan:   Principal Problem:   Acute metabolic encephalopathy Active Problems:   Hypertension   Hyperlipidemia   Elevated troponin   Hyperkalemia   Aphasia   High anion gap metabolic acidosis   [id number] virus infection   Acute kidney injury superimposed on CKD (HCC)   Dehydration with hypernatremia   Poor appetite   Goals of care, counseling/discussion   Palliative care encounter  Aphasia, intermittent Acute metabolic encephalopathy, intermittent -Suspect underlying metabolic issues versus infection.  CT head and MRI brain is negative -Neuro exam remains nonfocal -TSH, B12 within normal limits.  UA-negative, UDS-negative  Acute kidney injury on CKD stage III A Hyperkalemia -Baseline creatinine 1.3, last creatinine 3.9.  Renal ultrasound-negative -Gentle hydration.  Creatinine increased to 2.4 today.  IV fluids renewed  Multiple metabolic derangements -Hypernatremia, hyperkalemia, anion gap metabolic  acidosis.  Slowly improving  COVID-19 pneumonia -Oxygen levels-room air -Completed remdesivir course on 1/21.  Continue steroids -Routine: Labs have been reviewed including ferritin, LDH, CRP, d-dimer, fibrinogen.  Will need to trend this lab daily. -Vitamin C & Zinc. Prone >16hrs/day.  -Supportive care-antitussive, inhalers, I-S/flutter -CODE STATUS confirmed  Dysphagia -Speech and swallow-dysphagia 3 diet.  Additional supplements added.  Essential hypertension -Antihypertensives on hold  Hyperlipidemia -Statin  Diabetes mellitus type 2 -Continue current regimen.  Hyperlipidemia -Statin  Adult failure to thrive -Multifactorial complicated by ongoing issues and recent loss of spouse.  Palliative care medicine consulted to help establish goals of care Overall patient has poor prognosis given lack of desire to eat and drink which will further worsen his malnourishment.  DVT prophylaxis: SCDs Code Status: DNR Family Communication: Daughter updated. She is aware about level 2 Passr. Understand his poor prognosis.  Disposition Plan: Awaiting safe disposition.   Consultants:   Palliative care  Procedures:   None  Antimicrobials:   None   Subjective: Laying in the bed, Doesn't have any complaints. Still not eating.   Review of Systems Otherwise negative except as per HPI, including: General = no fevers, chills, dizziness,  fatigue HEENT/EYES = negative for loss of vision, double vision, blurred vision,  sore throa Cardiovascular= negative for chest pain, palpitation Respiratory/lungs= negative for shortness of breath, cough, wheezing; hemoptysis,  Gastrointestinal= negative for nausea, vomiting, abdominal pain Genitourinary= negative for Dysuria MSK = Negative for arthralgia, myalgias Neurology= Negative for headache, numbness, tingling  Psychiatry= Negative for suicidal and homocidal ideation Skin= Negative for Rash   Objective: Vitals:   05/19/19 1500  05/19/19 1728 05/19/19 2227 05/20/19 0417  BP: 90/60 97/67 106/87   Pulse:   72  Resp: 20  14   Temp: (!) 97.1 F (36.2 C)  97.7 F (36.5 C)   TempSrc: Oral  Oral   SpO2: 92%  90%   Weight:   49 kg 48.2 kg  Height:   5' 1.81" (1.57 m)     Intake/Output Summary (Last 24 hours) at 05/20/2019 1438 Last data filed at 05/20/2019 1950 Gross per 24 hour  Intake 1294.78 ml  Output 450 ml  Net 844.78 ml   Filed Weights   05/19/19 2227 05/20/19 0417  Weight: 49 kg 48.2 kg    Examination:  Constitutional: NAD, cachectic frail. Temporal wasting. Generalized muscle wasting.  Respiratory: Clear to auscultation bilaterally Cardiovascular: Normal sinus rhythm, no rubs Abdomen: Nontender nondistended good bowel sounds Musculoskeletal: No edema noted Skin: No rashes seen Neurologic: CN 2-12 grossly intact.  And nonfocal Psychiatric: Normal judgment and insight. Alert and oriented x 3. Normal mood.     Data Reviewed:   CBC: Recent Labs  Lab 2019-05-16 1543 05/16/19 1543 05/16/2019 1556 05/15/19 0029 05/18/19 0433 05/19/19 0523 05/20/19 1038  WBC 8.7  --   --  9.2 10.9* 9.5 12.5*  NEUTROABS 7.4  --   --   --  9.9* 8.5* 11.5*  HGB 17.8*   < > 18.4* 14.0 15.0 15.7 14.9  HCT 56.2*   < > 54.0* 44.0 46.3 47.5 45.4  MCV 90.9  --   --  89.8 89.0 86.7 88.2  PLT 305  --   --  235 252 219 199   < > = values in this interval not displayed.   Basic Metabolic Panel: Recent Labs  Lab 16-May-2019 1543 May 16, 2019 1556 05/15/19 0029 05/19/19 0523 05/20/19 1038  NA 151* 152* 154* 146* 148*  K 5.4* 5.3* 5.1 5.3* 5.7*  CL 112*  --  123* 119* 121*  CO2 17*  --  18* 15* 16*  GLUCOSE 144*  --  118* 140* 117*  BUN 117*  --  107* 82* 93*  CREATININE 3.93*  --  3.38* 1.96* 2.40*  CALCIUM 9.5  --  8.0* 8.5* 8.5*  MG  --   --   --  2.6* 2.5*  PHOS  --   --   --  4.1 3.3   GFR: Estimated Creatinine Clearance: 13.9 mL/min (A) (by C-G formula based on SCr of 2.4 mg/dL (H)). Liver Function  Tests: Recent Labs  Lab 2019/05/16 1543 05/19/19 0523 05/20/19 1038  AST 30 116* 98*  ALT 23 106* 96*  ALKPHOS 71 94 192*  BILITOT 1.3* 2.0* 1.8*  PROT 8.4* 5.6* 5.6*  ALBUMIN 3.1* 2.3* 2.3*   No results for input(s): LIPASE, AMYLASE in the last 168 hours. Recent Labs  Lab 05/15/19 0029  AMMONIA 29   Coagulation Profile: No results for input(s): INR, PROTIME in the last 168 hours. Cardiac Enzymes: Recent Labs  Lab 05-16-2019 1543  CKTOTAL 225   BNP (last 3 results) No results for input(s): PROBNP in the last 8760 hours. HbA1C: No results for input(s): HGBA1C in the last 72 hours. CBG: Recent Labs  Lab 05/19/19 1742 05/19/19 2220 05/20/19 0033 05/20/19 0344 05/20/19 1217  GLUCAP 124* 178* 186* 89 129*   Lipid Profile: No results for input(s): CHOL, HDL, LDLCALC, TRIG, CHOLHDL, LDLDIRECT in the last 72 hours. Thyroid Function Tests: No results for input(s): TSH, T4TOTAL, FREET4, T3FREE, THYROIDAB in the last 72 hours. Anemia Panel: Recent Labs    05/18/19 0433 05/19/19 0523  FERRITIN 1,056* 1,346*   Sepsis Labs:  Recent Labs  Lab 05/18/2019 2123 05/15/19 0029 05/15/19 0330  LATICACIDVEN 3.4* 1.8 2.1*    Recent Results (from the past 240 hour(s))  SARS CORONAVIRUS 2 (TAT 6-24 HRS) Nasopharyngeal Nasopharyngeal Swab     Status: Abnormal   Collection Time: 05/19/2019  4:47 PM   Specimen: Nasopharyngeal Swab  Result Value Ref Range Status   SARS Coronavirus 2 POSITIVE (A) NEGATIVE Final    Comment: RESULT CALLED TO, READ BACK BY AND VERIFIED WITH: N. Kerry Fort 1610 05/15/2019 T. TYSOR (NOTE) SARS-CoV-2 target nucleic acids are DETECTED. The SARS-CoV-2 RNA is generally detectable in upper and lower respiratory specimens during the acute phase of infection. Positive results are indicative of the presence of SARS-CoV-2 RNA. Clinical correlation with patient history and other diagnostic information is  necessary to determine patient infection status. Positive  results do not rule out bacterial infection or co-infection with other viruses.  The expected result is Negative. Fact Sheet for Patients: HairSlick.no Fact Sheet for Healthcare Providers: quierodirigir.com This test is not yet approved or cleared by the [place] FDA and  has been authorized for detection and/or diagnosis of SARS-CoV-2 by FDA under an Emergency Use Authorization (EUA). This EUA will remain  in effect (meaning this test can be used) for  the duration of the COVID-19 declaration under Section 564(b)(1) of the Act, 21 U.S.C. section 360bbb-3(b)(1), unless the authorization is terminated or revoked sooner. Performed at [facility], [address], [place], [place] [address]          Radiology Studies: No results found.      Scheduled Meds: . atorvastatin  20 mg Oral Daily  . chlorhexidine  15 mL Mouth Rinse BID  . dexamethasone (DECADRON) injection  6 mg Intravenous Q24H  . dorzolamide-timolol  1 drop Right Eye BID  . feeding supplement (ENSURE ENLIVE)  237 mL Oral TID BM  . insulin aspart  0-9 Units Subcutaneous Q4H  . mouth rinse  15 mL Mouth Rinse q12n4p  . OLANZapine zydis  2.5 mg Oral QHS   Continuous Infusions: . sodium chloride Stopped (05/20/19 0522)     LOS: 6 days   Time spent= 15 mins    Prima Rayner Joline Maxcy, MD [facility]  If 7PM-7AM, please contact night-coverage  05/20/2019, 2:38 PM

## 2019-05-20 NOTE — Plan of Care (Signed)
New IV placed in left upper arm and infusing 0.45 saline at 70mL/hr. Patient resting in bed. Refused his ensure and zypraxa this shift. Will continue to monitor.

## 2019-05-21 LAB — GLUCOSE, CAPILLARY
Glucose-Capillary: 161 mg/dL — ABNORMAL HIGH (ref 70–99)
Glucose-Capillary: 125 mg/dL — ABNORMAL HIGH (ref 70–99)
Glucose-Capillary: 104 mg/dL — ABNORMAL HIGH (ref 70–99)
Glucose-Capillary: 103 mg/dL — ABNORMAL HIGH (ref 70–99)
Glucose-Capillary: 88 mg/dL (ref 70–99)
Glucose-Capillary: 49 mg/dL — ABNORMAL LOW (ref 70–99)

## 2019-05-21 MED ORDER — SODIUM CHLORIDE 0.9 % IV BOLUS: 1000.0000 mL | Freq: Once | INTRAVENOUS | Status: DC

## 2019-05-21 MED ORDER — DEXTROSE 50 % IV SOLN
1.0000 | Freq: Once | INTRAVENOUS | Status: DC
  Filled 2019-05-21: qty 50

## 2019-05-21 MED ORDER — GLUCOSE 40 % PO GEL
2.0000 | ORAL | Status: AC
  Administered 2019-05-21: 21:00:00 75 g via ORAL
  Filled 2019-05-21: qty 2

## 2019-05-21 MED ORDER — DEXTROSE 50 % IV SOLN
INTRAVENOUS | Status: AC
  Filled 2019-05-21: qty 50

## 2019-05-21 MED ORDER — GLUCAGON HCL RDNA (DIAGNOSTIC) 1 MG IJ SOLR
1.0000 mg | Freq: Once | INTRAMUSCULAR | Status: AC | PRN
  Administered 2019-05-21: 22:00:00 1 mg via SUBCUTANEOUS
  Filled 2019-05-21: qty 1

## 2019-05-21 MED ORDER — DEXTROSE-NACL 5-0.9 % IV SOLN: INTRAVENOUS | Status: DC

## 2019-05-22 LAB — GLUCOSE, CAPILLARY: Glucose-Capillary: 54 mg/dL — ABNORMAL LOW (ref 70–99)

## 2019-05-22 NOTE — Progress Notes (Addendum)
CNP on call notified about patient blood sugar trending down and low blood pressure. Glucagon ordered and administered with no results. IV team present and placed iv, dextrose administer for low blood sugar with no results. No pulse, no respiration and no heart beat on auscultation/palpation, Patient pronounced dead at 2221-08-24 by myself and Felicie Morn. Provider, Bruna Potter, NP present. Patient cleaned and Family notified. Family came in to see the patient. Patient's belongings including two gold rings taken home by family.

## 2019-05-30 NOTE — Progress Notes (Signed)
   06-14-19 1058  Family/Significant Other Communication  Family/Significant Other Update Called;Updated (pt's daughter Gunnar Fusi)

## 2019-05-30 NOTE — Death Summary Note (Signed)
Death Summary  Henry Harvey [id number]:[id number] DOB: January 04, 1927 DOA: 06/06/2019  PCP: Dorothyann Peng, MD  Admit date: 06-06-2019 Date of Death: 2019/06/13 Time of Death: Aug 30, 2221 Notification: Dorothyann Peng, MD notified of death of 2019-06-14   History of present illness:  Code Status= DNR  Brief Summary/Hospital Stay:  86 year old recently widowed male, PMH of HTN, HLD, prior subdural hematoma s/p bur hole Aug 30, 2016, suspected dementia, recently lost his wife, last known normal was day prior to admission, presented with aphasia and altered mental status. At baseline reportedly alert, oriented, active, able to conduct all activities of daily living and take care of himself. Admitted for aphasia, acute metabolic encephalopathy, dehydration with hypernatremia, hyperkalemia, acute on chronic kidney disease and COVID-19 positive.  During the hospitalization patient was started on steroids and remdesivir.  He continued to have very poor oral intake, failure to thrive despite of encouragement.  He refused any lab work as well.  Daughter was aware, palliative care was consulted.  He was decided to transition patient to skilled nursing facility with palliative care follow-up.  Patient's condition continued to decline during the hospitalization due to multiple comorbidities, poor oral intake and failure to thrive.  He ended up passing away in the hospital on 06-13-19 at 10:23 PM.  He was pronounced by the night staff.   On physical exam: Pronounced dead by the nighttime staff  Final/Principal Diagnoses:  1. Acute kidney injury on CKD stage IIIa 2.  Failure to thrive, adult 3.  COVID-19 pneumonia 4.  Anion gap metabolic acidosis 5.  Hyperkalemia  Disposition/Follow up Care: Patient is deceased.   Discharge medications: None  The results of significant diagnostics from this hospitalization (including imaging, microbiology, ancillary and laboratory) are listed below for reference.    Significant Diagnostic  Studies: CT HEAD WO CONTRAST  Result Date: 06/06/19 CLINICAL DATA:  86 year old male with focal neurologic deficit. EXAM: CT HEAD WITHOUT CONTRAST TECHNIQUE: Contiguous axial images were obtained from the base of the skull through the vertex without intravenous contrast. COMPARISON:  Head CT dated 06/23/2016. FINDINGS: Brain: There is moderate age-related atrophy and chronic microvascular ischemic changes. There is no acute intracranial hemorrhage. Mass effect or midline shift. No extra-axial fluid collection. Vascular: No hyperdense vessel or unexpected calcification. Skull: Or no acute calvarial pathology. Bilateral there folds noted. Sinuses/Orbits: There is chronic opacification of the left sphenoid sinus and partial opacification of the left maxillary sinus. The remainder of the visualized paranasal sinuses and mastoid air cells are clear. Other: None IMPRESSION: 1. No acute intracranial hemorrhage. 2. Age-related atrophy and chronic microvascular ischemic changes. Electronically Signed   By: Elgie Collard M.D.   On: June 06, 2019 16:33   MR BRAIN WO CONTRAST  Result Date: 05/15/2019 CLINICAL DATA:  Altered mental status for 2 days. Patient is COVID 19 positive. EXAM: MRI HEAD WITHOUT CONTRAST TECHNIQUE: Multiplanar, multiecho pulse sequences of the brain and surrounding structures were obtained without intravenous contrast. COMPARISON:  CT head without contrast 06-06-2019 FINDINGS: Brain: Thin scratched at very thin chronic extra-axial collections are stable. No acute infarct, hemorrhage, or mass lesion is present. Advanced atrophy is present. Confluent periventricular and subcortical T2 hyperintensities are present bilaterally. 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 Additional remote infarcts are present in the posterior left sylvian fissure. The internal auditory canals are within normal limits. The brainstem and cerebellum are within normal limits. Vascular: Flow is  present in the major intracranial arteries. Skull and upper cervical spine: Chronic degenerative changes are present at  the craniocervical junction and C1-2. Progressive listhesis is present at C1-2 with narrowing of the central canal. No other significant listhesis is present in the upper cervical spine. Marrow signal is normal. Sinuses/Orbits: Bilateral mastoid effusions are present. The paranasal sinuses and mastoid air cells are otherwise clear. The globes and orbits are within normal limits. IMPRESSION: 1. No acute intracranial abnormality or significant interval change. 2. Advanced atrophy and diffuse white matter disease. This likely reflects the sequela of chronic microvascular ischemia. 3. Very thin chronic subdural hygromas are stable. 4. Bilateral mastoid effusions. Electronically Signed   By: San Morelle M.D.   On: 05/15/2019 13:34   US Renal  Result Date: 05/15/2019 CLINICAL DATA:  Acute kidney injury. EXAM: RENAL / URINARY TRACT ULTRASOUND COMPLETE COMPARISON:  None. FINDINGS: Right Kidney: Renal measurements: 9.0 x 4.5 x 3.6 cm = volume: 73 mL. Increased echogenicity of renal parenchyma is noted suggesting medical renal disease. No mass or hydronephrosis visualized. Left Kidney: Renal measurements: 7.3 x 4.8 x 4.5 cm = volume: 84 mL. Mildly increased echogenicity of renal parenchyma is noted. No mass or hydronephrosis visualized. Bladder: Possible small bladder diverticulum is noted. Mild prostatic enlargement is noted. Other: None. IMPRESSION: Bilateral renal atrophy is noted. Increased echogenicity of renal parenchyma is noted suggesting medical renal disease. Probable small bladder diverticulum is noted. Mild prostatic enlargement. Electronically Signed   By: Marijo Conception M.D.   On: 05/15/2019 10:06   DG Chest Portable 1 View  Result Date: 05/05/2019 CLINICAL DATA:  Hypoxia. EXAM: PORTABLE CHEST 1 VIEW COMPARISON:  May 28, 2016. FINDINGS: The heart size and mediastinal  contours are within normal limits. Both lungs are clear. No pneumothorax or pleural effusion is noted. Atherosclerosis of thoracic aorta is noted. The visualized skeletal structures are unremarkable. IMPRESSION: No active disease. Aortic Atherosclerosis (ICD10-I70.0). Electronically Signed   By: Marijo Conception M.D.   On: 05/25/2019 16:30    Microbiology: Recent Results (from the past 240 hour(s))  SARS CORONAVIRUS 2 (TAT 6-24 HRS) Nasopharyngeal Nasopharyngeal Swab     Status: Abnormal   Collection Time: 04/30/2019  4:47 PM   Specimen: Nasopharyngeal Swab  Result Value Ref Range Status   SARS Coronavirus 2 POSITIVE (A) NEGATIVE Final    Comment: RESULT CALLED TO, READ BACK BY AND VERIFIED WITH: N. Arcelia Jew [id number] 05/15/2019 T. TYSOR (NOTE) SARS-CoV-2 target nucleic acids are DETECTED. The SARS-CoV-2 RNA is generally detectable in upper and lower respiratory specimens during the acute phase of infection. Positive results are indicative of the presence of SARS-CoV-2 RNA. Clinical correlation with patient history and other diagnostic information is  necessary to determine patient infection status. Positive results do not rule out bacterial infection or co-infection with other viruses.  The expected result is Negative. Fact Sheet for Patients: SugarRoll.be Fact Sheet for Healthcare Providers: https://www.woods-mathews.com/ This test is not yet approved or cleared by the [place] FDA and  has been authorized for detection and/or diagnosis of SARS-CoV-2 by FDA under an Emergency Use Authorization (EUA). This EUA will remain  in effect (meaning this test can be used) for  the duration of the COVID-19 declaration under Section 564(b)(1) of the Act, 21 U.S.C. section 360bbb-3(b)(1), unless the authorization is terminated or revoked sooner. Performed at [facility], [place] [address] [place], [place] [address]      Labs: Basic Metabolic  Panel: Recent Labs  Lab 05/19/19 0523 05/20/19 1038  NA 146* 148*  K 5.3* 5.7*  CL 119* 121*  CO2  15* 16*  GLUCOSE 140* 117*  BUN 82* 93*  CREATININE 1.96* 2.40*  CALCIUM 8.5* 8.5*  MG 2.6* 2.5*  PHOS 4.1 3.3   Liver Function Tests: Recent Labs  Lab 05/19/19 0523 05/20/19 1038  AST 116* 98*  ALT 106* 96*  ALKPHOS 94 192*  BILITOT 2.0* 1.8*  PROT 5.6* 5.6*  ALBUMIN 2.3* 2.3*   No results for input(s): LIPASE, AMYLASE in the last 168 hours. No results for input(s): AMMONIA in the last 168 hours. CBC: Recent Labs  Lab 05/18/19 0433 05/19/19 0523 05/20/19 1038  WBC 10.9* 9.5 12.5*  NEUTROABS 9.9* 8.5* 11.5*  HGB 15.0 15.7 14.9  HCT 46.3 47.5 45.4  MCV 89.0 86.7 88.2  PLT 252 219 199   Cardiac Enzymes: No results for input(s): CKTOTAL, CKMB, CKMBINDEX, TROPONINI in the last 168 hours. D-Dimer No results for input(s): DDIMER in the last 72 hours. BNP: Invalid input(s): POCBNP CBG: Recent Labs  Lab 05/29/2019 0756 05/24/2019 1150 05/26/2019 1638 05/23/2019 2051 05/24/2019 2226  GLUCAP 104* 103* 88 54* 49*   Anemia work up No results for input(s): VITAMINB12, FOLATE, FERRITIN, TIBC, IRON, RETICCTPCT in the last 72 hours. Urinalysis    Component Value Date/Time   COLORURINE AMBER (A) June 07, 2019 2332   APPEARANCEUR CLOUDY (A) Jun 07, 2019 2332   LABSPEC 1.018 06/07/2019 2332   PHURINE 5.0 06/07/2019 2332   GLUCOSEU NEGATIVE June 07, 2019 2332   HGBUR SMALL (A) June 07, 2019 2332   BILIRUBINUR NEGATIVE June 07, 2019 2332   BILIRUBINUR negative 05/05/2019 0912   KETONESUR NEGATIVE June 07, 2019 2332   PROTEINUR 30 (A) 06/07/2019 2332   UROBILINOGEN 0.2 05/05/2019 0912   NITRITE NEGATIVE 06-07-19 2332   LEUKOCYTESUR NEGATIVE 06-07-19 2332   Sepsis Labs Invalid input(s): PROCALCITONIN,  WBC,  LACTICIDVEN   SIGNED:  Dimple Nanas, MD  [facility] 05/11/2019, 2:16 PM Pager   If 7PM-7AM, please contact night-coverage [email or web address] Password [email or web address]

## 2019-05-30 NOTE — Progress Notes (Signed)
PROGRESS NOTE    Henry Harvey  VFI:[id number] DOB: 06-27-1926 DOA: 05/15/2019 PCP: Dorothyann Peng, MD   Brief Narrative:  86 year old recently widowed male, PMH of HTN, HLD, prior subdural hematoma s/p bur hole 2018, suspected dementia, recently lost his wife, last known normal was day prior to admission, presented with aphasia and altered mental status.  At baseline reportedly alert, oriented, active, able to conduct all activities of daily living and take care of himself.  Admitted for aphasia, acute metabolic encephalopathy, dehydration with hypernatremia, hyperkalemia, acute on chronic kidney disease and COVID-19 positive.  During the hospitalization patient was started on steroids and remdesivir.  He continued to have very poor oral intake, failure to thrive despite of encouragement.  He refused any lab work as well.  Daughter was aware, palliative care was consulted.  He was decided to transition patient to skilled nursing facility with palliative care follow-up.   Assessment & Plan:   Principal Problem:   Acute metabolic encephalopathy Active Problems:   Hypertension   Hyperlipidemia   Elevated troponin   Hyperkalemia   Aphasia   High anion gap metabolic acidosis   COVID-19 virus infection   Acute kidney injury superimposed on CKD (HCC)   Dehydration with hypernatremia   Poor appetite   Goals of care, counseling/discussion   Palliative care encounter  Aphasia, intermittent Acute metabolic encephalopathy, continues to be intermittent -Suspect underlying metabolic issues versus infection.  CT head and MRI brain is negative -Neuro exam remains nonfocal -TSH, B12 within normal limits.  UA-negative, UDS-negative  Acute kidney injury on CKD stage III A Hyperkalemia -Baseline creatinine 1.3, last creatinine 3.9.  Renal ultrasound-negative -Gentle hydration.  Creatinine yesterday 2.4  Multiple metabolic derangements -Hypernatremia, hyperkalemia, anion gap metabolic acidosis.   Slowly improving  COVID-19 pneumonia -Oxygen levels-remains on room air -Completed remdesivir course on 1/21.  Continue steroids -Routine: Labs have been reviewed including ferritin, LDH, CRP, d-dimer, fibrinogen.  Will need to trend this lab daily. -Vitamin C & Zinc. Prone >16hrs/day.  -Supportive care-antitussive, inhalers, I-S/flutter -CODE STATUS confirmed  Dysphagia -Speech and swallow-dysphagia 3 diet.  Additional supplements added.  Essential hypertension -Antihypertensives on hold  Hyperlipidemia -Statin  Diabetes mellitus type 2 -Continue current regimen.  Hyperlipidemia -Statin  Adult failure to thrive -Multifactorial complicated issues-recent spouse loss.  Palliative care medicine consulted to establish goals of care.  Overall poor prognosis.  Palliative care team to follow once patient goes to SNF.  DVT prophylaxis: SCDs Code Status: DNR Family Communication: Spoke with the daughter yesterday. Disposition Plan: Awaiting safe disposition.  TOC aware  Consultants:   Palliative care  Procedures:   None  Antimicrobials:   None   Subjective: Laying in the bed, no complaints.  Continues to have poor oral intake  Review of Systems Otherwise negative except as per HPI, including: General = no fevers, chills, dizziness,  fatigue HEENT/EYES = negative for loss of vision, double vision, blurred vision,  sore throa Cardiovascular= negative for chest pain, palpitation Respiratory/lungs= negative for shortness of breath, cough, wheezing; hemoptysis,  Gastrointestinal= negative for nausea, vomiting, abdominal pain Genitourinary= negative for Dysuria MSK = Negative for arthralgia, myalgias Neurology= Negative for headache, numbness, tingling  Psychiatry= Negative for suicidal and homocidal ideation Skin= Negative for Rash    Objective: Vitals:   05/19/19 2227 05/20/19 0417 05/01/2019 0346 05/05/2019 0411  BP: 106/87   125/84  Pulse: 72   72  Resp: 14   16   Temp: 97.7 F (36.5 C)   (!) 97 F (  36.1 C)  TempSrc: Oral   Oral  SpO2: 90%   96%  Weight: 49 kg 48.2 kg 48.4 kg   Height: 5' 1.81" (1.57 m)       Intake/Output Summary (Last 24 hours) at 05/29/2019 1205 Last data filed at 05/20/2019 2148 Gross per 24 hour  Intake 237 ml  Output --  Net 237 ml   Filed Weights   05/19/19 2227 05/20/19 0417 05/18/2019 0346  Weight: 49 kg 48.2 kg 48.4 kg    Examination:  Constitutional: Elderly cachectic frail with temporal muscle wasting, generalized muscle wasting. Respiratory: Clear to auscultation bilaterally Cardiovascular: Normal sinus rhythm, no rubs Abdomen: Nontender nondistended good bowel sounds Musculoskeletal: No edema noted Skin: No rashes seen Neurologic: CN 2-12 grossly intact.  And nonfocal Psychiatric: Overall poor judgment and insight.  Data Reviewed:   CBC: Recent Labs  Lab 05/18/2019 1543 05/19/2019 1543 05/19/2019 1556 05/15/19 0029 05/18/19 0433 05/19/19 0523 05/20/19 1038  WBC 8.7  --   --  9.2 10.9* 9.5 12.5*  NEUTROABS 7.4  --   --   --  9.9* 8.5* 11.5*  HGB 17.8*   < > 18.4* 14.0 15.0 15.7 14.9  HCT 56.2*   < > 54.0* 44.0 46.3 47.5 45.4  MCV 90.9  --   --  89.8 89.0 86.7 88.2  PLT 305  --   --  235 252 219 199   < > = values in this interval not displayed.   Basic Metabolic Panel: Recent Labs  Lab 05/25/2019 1543 05/26/2019 1556 05/15/19 0029 05/19/19 0523 05/20/19 1038  NA 151* 152* 154* 146* 148*  K 5.4* 5.3* 5.1 5.3* 5.7*  CL 112*  --  123* 119* 121*  CO2 17*  --  18* 15* 16*  GLUCOSE 144*  --  118* 140* 117*  BUN 117*  --  107* 82* 93*  CREATININE 3.93*  --  3.38* 1.96* 2.40*  CALCIUM 9.5  --  8.0* 8.5* 8.5*  MG  --   --   --  2.6* 2.5*  PHOS  --   --   --  4.1 3.3   GFR: Estimated Creatinine Clearance: 14 mL/min (A) (by C-G formula based on SCr of 2.4 mg/dL (H)). Liver Function Tests: Recent Labs  Lab 05/07/2019 1543 05/19/19 0523 05/20/19 1038  AST 30 116* 98*  ALT 23 106* 96*  ALKPHOS  71 94 192*  BILITOT 1.3* 2.0* 1.8*  PROT 8.4* 5.6* 5.6*  ALBUMIN 3.1* 2.3* 2.3*   No results for input(s): LIPASE, AMYLASE in the last 168 hours. Recent Labs  Lab 05/15/19 0029  AMMONIA 29   Coagulation Profile: No results for input(s): INR, PROTIME in the last 168 hours. Cardiac Enzymes: Recent Labs  Lab 05/02/2019 1543  CKTOTAL 225   BNP (last 3 results) No results for input(s): PROBNP in the last 8760 hours. HbA1C: No results for input(s): HGBA1C in the last 72 hours. CBG: Recent Labs  Lab 05/20/19 1949 05/07/2019 0143 05/17/2019 0401 05/11/2019 0756 05/25/2019 1150  GLUCAP 152* 161* 125* 104* 103*   Lipid Profile: No results for input(s): CHOL, HDL, LDLCALC, TRIG, CHOLHDL, LDLDIRECT in the last 72 hours. Thyroid Function Tests: No results for input(s): TSH, T4TOTAL, FREET4, T3FREE, THYROIDAB in the last 72 hours. Anemia Panel: Recent Labs    05/19/19 0523  FERRITIN 1,346*   Sepsis Labs: Recent Labs  Lab 05/23/2019 2123 05/15/19 0029 05/15/19 0330  LATICACIDVEN 3.4* 1.8 2.1*    Recent Results (from the past  240 hour(s))  SARS CORONAVIRUS 2 (TAT 6-24 HRS) Nasopharyngeal Nasopharyngeal Swab     Status: Abnormal   Collection Time: 05/19/2019  4:47 PM   Specimen: Nasopharyngeal Swab  Result Value Ref Range Status   SARS Coronavirus 2 POSITIVE (A) NEGATIVE Final    Comment: RESULT CALLED TO, READ BACK BY AND VERIFIED WITH: N. Kerry Fort [id number] 05/15/2019 T. TYSOR (NOTE) SARS-CoV-2 target nucleic acids are DETECTED. The SARS-CoV-2 RNA is generally detectable in upper and lower respiratory specimens during the acute phase of infection. Positive results are indicative of the presence of SARS-CoV-2 RNA. Clinical correlation with patient history and other diagnostic information is  necessary to determine patient infection status. Positive results do not rule out bacterial infection or co-infection with other viruses.  The expected result is Negative. Fact Sheet for  Patients: HairSlick.no Fact Sheet for Healthcare Providers: quierodirigir.com This test is not yet approved or cleared by the [place] FDA and  has been authorized for detection and/or diagnosis of SARS-CoV-2 by FDA under an Emergency Use Authorization (EUA). This EUA will remain  in effect (meaning this test can be used) for  the duration of the COVID-19 declaration under Section 564(b)(1) of the Act, 21 U.S.C. section 360bbb-3(b)(1), unless the authorization is terminated or revoked sooner. Performed at [facility], [address], [place], [place] [address]          Radiology Studies: No results found.      Scheduled Meds: . atorvastatin  20 mg Oral Daily  . chlorhexidine  15 mL Mouth Rinse BID  . dexamethasone (DECADRON) injection  6 mg Intravenous Q24H  . dorzolamide-timolol  1 drop Right Eye BID  . feeding supplement (ENSURE ENLIVE)  237 mL Oral TID BM  . insulin aspart  0-9 Units Subcutaneous Q4H  . mouth rinse  15 mL Mouth Rinse q12n4p  . OLANZapine zydis  2.5 mg Oral QHS   Continuous Infusions:    LOS: 7 days   Time spent=155 mins    Everly Rubalcava Joline Maxcy, MD [facility]  If 7PM-7AM, please contact night-coverage  05/03/2019, 12:05 PM

## 2019-05-30 NOTE — Progress Notes (Addendum)
Upon shift change, pt restless and noted that pt had pulled out PIV access. Pt has also removing gown and sheets numerous times this shift. Sip of water given to patient. Refused dinner tray. Oncoming nurse aware of situation.

## 2019-05-30 DEATH — deceased

## 2019-06-01 ENCOUNTER — Telehealth: Payer: Self-pay

## 2019-08-04 ENCOUNTER — Ambulatory Visit: Payer: Medicare Other | Admitting: Internal Medicine

## 2019-10-20 ENCOUNTER — Encounter: Payer: Medicare HMO | Admitting: Internal Medicine

## 2019-10-20 ENCOUNTER — Ambulatory Visit: Payer: Medicare HMO
